# Patient Record
Sex: Male | Born: 1950 | Race: Black or African American | Hispanic: No | Marital: Married | Smoking: Current every day smoker
Health system: Southern US, Community
[De-identification: ages and names within clinical notes are randomized; demographics above are authoritative.]

## PROBLEM LIST (undated history)

## (undated) ENCOUNTER — Emergency Department (HOSPITAL_BASED_OUTPATIENT_CLINIC_OR_DEPARTMENT_OTHER): Payer: Medicare HMO

## (undated) DIAGNOSIS — N189 Chronic kidney disease, unspecified: Secondary | ICD-10-CM

## (undated) DIAGNOSIS — I10 Essential (primary) hypertension: Secondary | ICD-10-CM

## (undated) DIAGNOSIS — M1990 Unspecified osteoarthritis, unspecified site: Secondary | ICD-10-CM

---

## 2007-05-28 HISTORY — PX: HIP ARTHROPLASTY: SHX981

## 2011-12-04 ENCOUNTER — Other Ambulatory Visit: Payer: Self-pay | Admitting: Neurosurgery

## 2011-12-05 ENCOUNTER — Encounter (HOSPITAL_COMMUNITY): Payer: Self-pay | Admitting: Pharmacy Technician

## 2011-12-12 ENCOUNTER — Encounter (HOSPITAL_COMMUNITY): Payer: Self-pay

## 2011-12-12 ENCOUNTER — Encounter (HOSPITAL_COMMUNITY)
Admission: RE | Admit: 2011-12-12 | Discharge: 2011-12-12 | Disposition: A | Discharge status: Home / Self Care | Payer: Worker's Compensation | Source: Ambulatory Visit | Attending: Neurosurgery | Admitting: Neurosurgery

## 2011-12-12 DIAGNOSIS — E119 Type 2 diabetes mellitus without complications: Secondary | ICD-10-CM | POA: Insufficient documentation

## 2011-12-12 DIAGNOSIS — Z01811 Encounter for preprocedural respiratory examination: Secondary | ICD-10-CM | POA: Insufficient documentation

## 2011-12-12 DIAGNOSIS — Z01810 Encounter for preprocedural cardiovascular examination: Secondary | ICD-10-CM | POA: Insufficient documentation

## 2011-12-12 DIAGNOSIS — Z01812 Encounter for preprocedural laboratory examination: Secondary | ICD-10-CM | POA: Insufficient documentation

## 2011-12-12 DIAGNOSIS — Z01818 Encounter for other preprocedural examination: Secondary | ICD-10-CM | POA: Insufficient documentation

## 2011-12-12 DIAGNOSIS — Z538 Procedure and treatment not carried out for other reasons: Secondary | ICD-10-CM | POA: Insufficient documentation

## 2011-12-12 DIAGNOSIS — I10 Essential (primary) hypertension: Secondary | ICD-10-CM | POA: Insufficient documentation

## 2011-12-12 HISTORY — DX: Chronic kidney disease, unspecified: N18.9

## 2011-12-12 HISTORY — DX: Essential (primary) hypertension: I10

## 2011-12-12 HISTORY — DX: Unspecified osteoarthritis, unspecified site: M19.90

## 2011-12-12 LAB — BASIC METABOLIC PANEL
CO2: 26 mEq/L (ref 19–32)
Calcium: 10 mg/dL (ref 8.4–10.5)
Creatinine, Ser: 1.08 mg/dL (ref 0.50–1.35)
Glucose, Bld: 84 mg/dL (ref 70–99)

## 2011-12-12 LAB — CBC
HCT: 41 % (ref 39.0–52.0)
MCH: 32.6 pg (ref 26.0–34.0)
MCV: 94.9 fL (ref 78.0–100.0)
Platelets: 209 10*3/uL (ref 150–400)
RDW: 13.9 % (ref 11.5–15.5)

## 2011-12-12 NOTE — Pre-Procedure Instructions (Signed)
18 Samuel Acosta  12/12/2011   Your procedure is scheduled on:  Wednesday, July 24th.  Report to [facility] at 6:30AM.  Call this number if you have problems the morning of surgery: [phone]   Remember:   Do not eat food or anything to drink:After Midnight.      Take these medicines the morning of surgery with A SIP OF WATER: Metoprolol (Toprolol XL).   Stop taking any Aspirin, NSAIDS (Mobic, Ibuprofen, Advil, Naproxen) And any herbal medicaltions (Fish Oil)  Do not wear jewelry, make-up or nail polish.  Do not wear lotions, powders, or perfumes. You may wear deodorant.  Do not shave 48 hours prior to surgery. Men may shave face and neck.  Do not bring valuables to the hospital.  Contacts, dentures or bridgework may not be worn into surgery.  Leave suitcase in the car. After surgery it may be brought to your room.  For patients admitted to the hospital, checkout time is 11:00 AM the day of discharge.   Patients discharged the day of surgery will not be allowed to drive home.  Name and phone number of your driver: NA  Special Instructions: CHG Shower Use Special Wash: 1/2 bottle night before surgery and 1/2 bottle morning of surgery.   Please read over the following fact sheets that you were given: Pain Booklet, Coughing and Deep Breathing and Surgical Site Infection Prevention

## 2011-12-12 NOTE — Progress Notes (Signed)
I requested office notes and EKG and any cardiac studies from pts APCP - Dr Lyn Hollingshead in [place], [place]

## 2011-12-13 NOTE — Progress Notes (Signed)
RECEIVED OV AND EKG DATED 07/23/2011.Marland Kitchen PLACED IN CHART .Marland KitchenMarland Kitchen ALSO RECEIVED ECHO 2005.Marland Kitchen PLACED IN CHART.

## 2011-12-18 NOTE — Progress Notes (Signed)
Called patient and notified him of arrival time of 06:30 on 8/7. Patient states that he still has instruction sheet. Requested that he follow all other instructions on sheet. Patient told to stop medications listed on sheet to stop on 7/31

## 2011-12-31 ENCOUNTER — Encounter (HOSPITAL_COMMUNITY): Payer: Self-pay

## 2011-12-31 MED ORDER — DEXAMETHASONE SODIUM PHOSPHATE 10 MG/ML IJ SOLN
10.0000 mg | INTRAMUSCULAR | Status: AC
  Administered 2012-01-01: 10 mg via INTRAVENOUS
  Filled 2011-12-31: qty 1

## 2011-12-31 MED ORDER — CEFAZOLIN SODIUM-DEXTROSE 2-3 GM-% IV SOLR
2.0000 g | INTRAVENOUS | Status: AC
  Administered 2012-01-01: 2 g via INTRAVENOUS
  Filled 2011-12-31: qty 50

## 2011-12-31 NOTE — Progress Notes (Signed)
Contacted Dr. Lonie Peak office, left voicemail for [place] requesting consent for surgery date 01/01/12

## 2012-01-01 ENCOUNTER — Ambulatory Visit (HOSPITAL_COMMUNITY): Payer: Worker's Compensation | Admitting: Anesthesiology

## 2012-01-01 ENCOUNTER — Encounter (HOSPITAL_COMMUNITY): Payer: Self-pay | Admitting: Anesthesiology

## 2012-01-01 ENCOUNTER — Inpatient Hospital Stay (HOSPITAL_COMMUNITY)
Admission: RE | Admit: 2012-01-01 | Discharge: 2012-01-02 | DRG: 473 | Disposition: A | Discharge status: Home / Self Care | Payer: Worker's Compensation | Source: Ambulatory Visit | Attending: Neurosurgery | Admitting: Neurosurgery

## 2012-01-01 ENCOUNTER — Ambulatory Visit (HOSPITAL_COMMUNITY): Payer: Worker's Compensation

## 2012-01-01 ENCOUNTER — Encounter (HOSPITAL_COMMUNITY)
Admission: RE | Disposition: A | Discharge status: Home / Self Care | Payer: Self-pay | Source: Ambulatory Visit | Attending: Neurosurgery

## 2012-01-01 DIAGNOSIS — I129 Hypertensive chronic kidney disease with stage 1 through stage 4 chronic kidney disease, or unspecified chronic kidney disease: Secondary | ICD-10-CM | POA: Diagnosis present

## 2012-01-01 DIAGNOSIS — M129 Arthropathy, unspecified: Secondary | ICD-10-CM | POA: Diagnosis present

## 2012-01-01 DIAGNOSIS — Z87442 Personal history of urinary calculi: Secondary | ICD-10-CM

## 2012-01-01 DIAGNOSIS — F17200 Nicotine dependence, unspecified, uncomplicated: Secondary | ICD-10-CM | POA: Diagnosis present

## 2012-01-01 DIAGNOSIS — E119 Type 2 diabetes mellitus without complications: Secondary | ICD-10-CM | POA: Diagnosis present

## 2012-01-01 DIAGNOSIS — M5000 Cervical disc disorder with myelopathy, unspecified cervical region: Principal | ICD-10-CM | POA: Diagnosis present

## 2012-01-01 DIAGNOSIS — N189 Chronic kidney disease, unspecified: Secondary | ICD-10-CM | POA: Diagnosis present

## 2012-01-01 HISTORY — PX: ANTERIOR CERVICAL DECOMP/DISCECTOMY FUSION: SHX1161

## 2012-01-01 LAB — BASIC METABOLIC PANEL
BUN: 14 mg/dL (ref 6–23)
Calcium: 9.8 mg/dL (ref 8.4–10.5)
GFR calc Af Amer: 78 mL/min — ABNORMAL LOW (ref >90)
GFR calc non Af Amer: 67 mL/min — ABNORMAL LOW (ref >90)
Potassium: 4.1 mEq/L (ref 3.5–5.1)
Sodium: 139 mEq/L (ref 135–145)

## 2012-01-01 LAB — CBC
HCT: 41.9 % (ref 39.0–52.0)
MCH: 32.1 pg (ref 26.0–34.0)
MCHC: 33.7 g/dL (ref 30.0–36.0)
RDW: 14.4 % (ref 11.5–15.5)

## 2012-01-01 LAB — GLUCOSE, CAPILLARY

## 2012-01-01 SURGERY — ANTERIOR CERVICAL DECOMPRESSION/DISCECTOMY FUSION 3 LEVELS
Anesthesia: General | Site: Neck | Wound class: Clean

## 2012-01-01 MED ORDER — THROMBIN 20000 UNITS EX KIT
PACK | CUTANEOUS | Status: DC | PRN
  Administered 2012-01-01: 09:00:00 via TOPICAL

## 2012-01-01 MED ORDER — MIDAZOLAM HCL 5 MG/5ML IJ SOLN
INTRAMUSCULAR | Status: DC | PRN
  Administered 2012-01-01: 1 mg via INTRAVENOUS

## 2012-01-01 MED ORDER — HYDROMORPHONE HCL PF 1 MG/ML IJ SOLN: 0.5000 mg | INTRAMUSCULAR | Status: DC | PRN

## 2012-01-01 MED ORDER — ASPIRIN EC 81 MG PO TBEC
81.0000 mg | DELAYED_RELEASE_TABLET | Freq: Every day | ORAL | Status: DC
  Administered 2012-01-01: 81 mg via ORAL
  Filled 2012-01-01 (×2): qty 1

## 2012-01-01 MED ORDER — PROMETHAZINE HCL 25 MG/ML IJ SOLN: 6.2500 mg | INTRAMUSCULAR | Status: DC | PRN

## 2012-01-01 MED ORDER — ONDANSETRON HCL 4 MG/2ML IJ SOLN: 4.0000 mg | INTRAMUSCULAR | Status: DC | PRN

## 2012-01-01 MED ORDER — ONDANSETRON HCL 4 MG/2ML IJ SOLN
INTRAMUSCULAR | Status: DC | PRN
  Administered 2012-01-01: 4 mg via INTRAVENOUS

## 2012-01-01 MED ORDER — PHENOL 1.4 % MT LIQD
1.0000 | OROMUCOSAL | Status: DC | PRN
  Administered 2012-01-01: 1 via OROMUCOSAL
  Filled 2012-01-01: qty 177

## 2012-01-01 MED ORDER — NEOSTIGMINE METHYLSULFATE 1 MG/ML IJ SOLN
INTRAMUSCULAR | Status: DC | PRN
  Administered 2012-01-01: 3 mg via INTRAVENOUS

## 2012-01-01 MED ORDER — ACETAMINOPHEN 325 MG PO TABS: 650.0000 mg | ORAL_TABLET | ORAL | Status: DC | PRN

## 2012-01-01 MED ORDER — ACETAMINOPHEN 650 MG RE SUPP: 650.0000 mg | RECTAL | Status: DC | PRN

## 2012-01-01 MED ORDER — METFORMIN HCL 850 MG PO TABS
850.0000 mg | ORAL_TABLET | Freq: Two times a day (BID) | ORAL | Status: DC
  Administered 2012-01-01 – 2012-01-02 (×2): 850 mg via ORAL
  Filled 2012-01-01 (×4): qty 1

## 2012-01-01 MED ORDER — CEFAZOLIN SODIUM 1-5 GM-% IV SOLN
1.0000 g | Freq: Three times a day (TID) | INTRAVENOUS | Status: AC
  Administered 2012-01-01 – 2012-01-02 (×2): 1 g via INTRAVENOUS
  Filled 2012-01-01 (×2): qty 50

## 2012-01-01 MED ORDER — THROMBIN 5000 UNITS EX SOLR
CUTANEOUS | Status: DC | PRN
  Administered 2012-01-01: 11:00:00 via TOPICAL

## 2012-01-01 MED ORDER — FENTANYL CITRATE 0.05 MG/ML IJ SOLN
INTRAMUSCULAR | Status: DC | PRN
  Administered 2012-01-01: 100 ug via INTRAVENOUS
  Administered 2012-01-01 (×4): 50 ug via INTRAVENOUS

## 2012-01-01 MED ORDER — GLYCOPYRROLATE 0.2 MG/ML IJ SOLN
INTRAMUSCULAR | Status: DC | PRN
  Administered 2012-01-01: 0.6 mg via INTRAVENOUS

## 2012-01-01 MED ORDER — PHENOL 1.4 % MT LIQD: 1.0000 | OROMUCOSAL | Status: DC | PRN

## 2012-01-01 MED ORDER — CYCLOBENZAPRINE HCL 10 MG PO TABS
10.0000 mg | ORAL_TABLET | Freq: Three times a day (TID) | ORAL | Status: DC | PRN
  Administered 2012-01-01: 10 mg via ORAL
  Filled 2012-01-01: qty 1

## 2012-01-01 MED ORDER — BACITRACIN 50000 UNITS IM SOLR
INTRAMUSCULAR | Status: AC
  Filled 2012-01-01: qty 1

## 2012-01-01 MED ORDER — SODIUM CHLORIDE 0.9 % IJ SOLN
3.0000 mL | Freq: Two times a day (BID) | INTRAMUSCULAR | Status: DC
  Administered 2012-01-01 (×2): 3 mL via INTRAVENOUS

## 2012-01-01 MED ORDER — MEPERIDINE HCL 25 MG/ML IJ SOLN: 6.2500 mg | INTRAMUSCULAR | Status: DC | PRN

## 2012-01-01 MED ORDER — SODIUM CHLORIDE 0.9 % IV SOLN
INTRAVENOUS | Status: AC
  Filled 2012-01-01: qty 500

## 2012-01-01 MED ORDER — SODIUM CHLORIDE 0.9 % IV SOLN: 250.0000 mL | INTRAVENOUS | Status: DC

## 2012-01-01 MED ORDER — ROCURONIUM BROMIDE 100 MG/10ML IV SOLN
INTRAVENOUS | Status: DC | PRN
  Administered 2012-01-01: 50 mg via INTRAVENOUS
  Administered 2012-01-01 (×2): 10 mg via INTRAVENOUS

## 2012-01-01 MED ORDER — LISINOPRIL 10 MG PO TABS
10.0000 mg | ORAL_TABLET | Freq: Every day | ORAL | Status: DC
  Administered 2012-01-01: 10 mg via ORAL
  Filled 2012-01-01 (×2): qty 1

## 2012-01-01 MED ORDER — LACTATED RINGERS IV SOLN
INTRAVENOUS | Status: DC | PRN
  Administered 2012-01-01 (×2): via INTRAVENOUS

## 2012-01-01 MED ORDER — ALUM & MAG HYDROXIDE-SIMETH 200-200-20 MG/5ML PO SUSP: 30.0000 mL | Freq: Four times a day (QID) | ORAL | Status: DC | PRN

## 2012-01-01 MED ORDER — HYDROMORPHONE HCL PF 1 MG/ML IJ SOLN
0.2500 mg | INTRAMUSCULAR | Status: DC | PRN
  Administered 2012-01-01 (×2): 0.5 mg via INTRAVENOUS

## 2012-01-01 MED ORDER — OXYCODONE-ACETAMINOPHEN 5-325 MG PO TABS
1.0000 | ORAL_TABLET | ORAL | Status: DC | PRN
  Administered 2012-01-01 – 2012-01-02 (×4): 2 via ORAL
  Filled 2012-01-01 (×4): qty 2

## 2012-01-01 MED ORDER — METOPROLOL SUCCINATE ER 100 MG PO TB24
100.0000 mg | ORAL_TABLET | Freq: Every day | ORAL | Status: DC
  Filled 2012-01-01: qty 1

## 2012-01-01 MED ORDER — PROPOFOL 10 MG/ML IV EMUL
INTRAVENOUS | Status: DC | PRN
  Administered 2012-01-01: 150 mg via INTRAVENOUS

## 2012-01-01 MED ORDER — PIOGLITAZONE HCL-METFORMIN HCL 15-850 MG PO TABS: 1.0000 | ORAL_TABLET | Freq: Two times a day (BID) | ORAL | Status: DC

## 2012-01-01 MED ORDER — LINAGLIPTIN 5 MG PO TABS
5.0000 mg | ORAL_TABLET | Freq: Every day | ORAL | Status: DC
  Administered 2012-01-01: 5 mg via ORAL
  Filled 2012-01-01 (×2): qty 1

## 2012-01-01 MED ORDER — PIOGLITAZONE HCL 15 MG PO TABS
15.0000 mg | ORAL_TABLET | Freq: Two times a day (BID) | ORAL | Status: DC
  Administered 2012-01-01 – 2012-01-02 (×2): 15 mg via ORAL
  Filled 2012-01-01 (×4): qty 1

## 2012-01-01 MED ORDER — LISINOPRIL-HYDROCHLOROTHIAZIDE 10-12.5 MG PO TABS: 1.0000 | ORAL_TABLET | Freq: Every day | ORAL | Status: DC

## 2012-01-01 MED ORDER — LIDOCAINE HCL (CARDIAC) 20 MG/ML IV SOLN
INTRAVENOUS | Status: DC | PRN
  Administered 2012-01-01: 50 mg via INTRAVENOUS

## 2012-01-01 MED ORDER — 0.9 % SODIUM CHLORIDE (POUR BTL) OPTIME
TOPICAL | Status: DC | PRN
  Administered 2012-01-01: 1000 mL

## 2012-01-01 MED ORDER — SODIUM CHLORIDE 0.9 % IR SOLN
Status: DC | PRN
  Administered 2012-01-01: 09:00:00

## 2012-01-01 MED ORDER — MENTHOL 3 MG MT LOZG: 1.0000 | LOZENGE | OROMUCOSAL | Status: DC | PRN

## 2012-01-01 MED ORDER — HYDROCHLOROTHIAZIDE 12.5 MG PO CAPS
12.5000 mg | ORAL_CAPSULE | Freq: Every day | ORAL | Status: DC
  Administered 2012-01-01: 12.5 mg via ORAL
  Filled 2012-01-01 (×2): qty 1

## 2012-01-01 MED ORDER — HYDROMORPHONE HCL PF 1 MG/ML IJ SOLN
INTRAMUSCULAR | Status: AC
  Filled 2012-01-01: qty 1

## 2012-01-01 SURGICAL SUPPLY — 60 items
BAG DECANTER FOR FLEXI CONT (MISCELLANEOUS) ×2 IMPLANT
BENZOIN TINCTURE PRP APPL 2/3 (GAUZE/BANDAGES/DRESSINGS) ×2 IMPLANT
BRUSH SCRUB EZ PLAIN DRY (MISCELLANEOUS) ×2 IMPLANT
BUR MATCHSTICK NEURO 3.0 LAGG (BURR) ×4 IMPLANT
CANISTER SUCTION 2500CC (MISCELLANEOUS) ×2 IMPLANT
CLOTH BEACON ORANGE TIMEOUT ST (SAFETY) ×2 IMPLANT
CONT SPEC 4OZ CLIKSEAL STRL BL (MISCELLANEOUS) ×2 IMPLANT
DECANTER SPIKE VIAL GLASS SM (MISCELLANEOUS) ×2 IMPLANT
DERMABOND ADVANCED (GAUZE/BANDAGES/DRESSINGS) ×1
DERMABOND ADVANCED .7 DNX12 (GAUZE/BANDAGES/DRESSINGS) ×1 IMPLANT
DRAIN SNY WOU 7FLT (WOUND CARE) ×2 IMPLANT
DRAPE C-ARM 42X72 X-RAY (DRAPES) ×4 IMPLANT
DRAPE LAPAROTOMY 100X72 PEDS (DRAPES) ×2 IMPLANT
DRAPE MICROSCOPE ZEISS OPMI (DRAPES) ×2 IMPLANT
DRAPE POUCH INSTRU U-SHP 10X18 (DRAPES) ×2 IMPLANT
DRILL BIT (BIT) ×2 IMPLANT
DRSG OPSITE 4X5.5 SM (GAUZE/BANDAGES/DRESSINGS) ×2 IMPLANT
ELECT COATED BLADE 2.86 ST (ELECTRODE) ×2 IMPLANT
ELECT REM PT RETURN 9FT ADLT (ELECTROSURGICAL) ×2
ELECTRODE REM PT RTRN 9FT ADLT (ELECTROSURGICAL) ×1 IMPLANT
EVACUATOR SILICONE 100CC (DRAIN) ×2 IMPLANT
GAUZE SPONGE 4X4 16PLY XRAY LF (GAUZE/BANDAGES/DRESSINGS) ×2 IMPLANT
GLOVE BIO SURGEON STRL SZ8 (GLOVE) ×2 IMPLANT
GLOVE BIOGEL PI IND STRL 8 (GLOVE) ×2 IMPLANT
GLOVE BIOGEL PI INDICATOR 8 (GLOVE) ×2
GLOVE ECLIPSE 7.5 STRL STRAW (GLOVE) ×6 IMPLANT
GLOVE EXAM NITRILE LRG STRL (GLOVE) IMPLANT
GLOVE EXAM NITRILE MD LF STRL (GLOVE) ×4 IMPLANT
GLOVE EXAM NITRILE XL STR (GLOVE) IMPLANT
GLOVE EXAM NITRILE XS STR PU (GLOVE) IMPLANT
GLOVE INDICATOR 8.5 STRL (GLOVE) ×2 IMPLANT
GOWN BRE IMP SLV AUR LG STRL (GOWN DISPOSABLE) ×2 IMPLANT
GOWN BRE IMP SLV AUR XL STRL (GOWN DISPOSABLE) ×4 IMPLANT
GOWN STRL REIN 2XL LVL4 (GOWN DISPOSABLE) ×2 IMPLANT
HEAD HALTER (SOFTGOODS) ×2 IMPLANT
HEMOSTAT POWDER KIT SURGIFOAM (HEMOSTASIS) IMPLANT
KIT BASIN OR (CUSTOM PROCEDURE TRAY) ×2 IMPLANT
KIT ROOM TURNOVER OR (KITS) ×2 IMPLANT
NEEDLE SPNL 20GX3.5 QUINCKE YW (NEEDLE) ×2 IMPLANT
NS IRRIG 1000ML POUR BTL (IV SOLUTION) ×2 IMPLANT
PACK LAMINECTOMY NEURO (CUSTOM PROCEDURE TRAY) ×2 IMPLANT
PLATE 3 57.5XLCK NS SPNE CVD (Plate) ×1 IMPLANT
PLATE 3 ATLANTIS TRANS (Plate) ×1 IMPLANT
PUTTY BONE DBX 2.5 MIS (Bone Implant) ×2 IMPLANT
RUBBERBAND STERILE (MISCELLANEOUS) ×4 IMPLANT
SCREW ST FIX 4 ATL 3120213 (Screw) ×16 IMPLANT
SPACER COLONIAL LGE 8MM 7DEG (Spacer) ×6 IMPLANT
SPONGE GAUZE 4X4 12PLY (GAUZE/BANDAGES/DRESSINGS) ×2 IMPLANT
SPONGE INTESTINAL PEANUT (DISPOSABLE) ×2 IMPLANT
SPONGE SURGIFOAM ABS GEL 100 (HEMOSTASIS) ×2 IMPLANT
STRIP CLOSURE SKIN 1/2X4 (GAUZE/BANDAGES/DRESSINGS) ×2 IMPLANT
SUT VIC AB 3-0 SH 8-18 (SUTURE) ×2 IMPLANT
SUT VICRYL 4-0 PS2 18IN ABS (SUTURE) ×2 IMPLANT
SYR 20ML ECCENTRIC (SYRINGE) ×2 IMPLANT
TAPE CLOTH 4X10 WHT NS (GAUZE/BANDAGES/DRESSINGS) IMPLANT
TOWEL OR 17X24 6PK STRL BLUE (TOWEL DISPOSABLE) ×2 IMPLANT
TOWEL OR 17X26 10 PK STRL BLUE (TOWEL DISPOSABLE) ×2 IMPLANT
TRAP SPECIMEN MUCOUS 40CC (MISCELLANEOUS) ×2 IMPLANT
TRAY FOLEY CATH 14FRSI W/METER (CATHETERS) ×2 IMPLANT
WATER STERILE IRR 1000ML POUR (IV SOLUTION) ×2 IMPLANT

## 2012-01-01 NOTE — Progress Notes (Signed)
Orthopedic Tech Progress Note Patient Details:  Samuel Acosta Oct 25, 1950 [id number] Soft collar fitted by [employer] vendor, went to room and patient had it on. Patient ID: [name], male   DOB: 09/15/1950, 61 y.o.   MRN: [id number]   Jennye Moccasin 01/01/2012, 8:01 PM

## 2012-01-01 NOTE — Anesthesia Postprocedure Evaluation (Signed)
  Anesthesia Post-op Note  Patient: Samuel Acosta  Procedure(s) Performed: Procedure(s) (LRB): ANTERIOR CERVICAL DECOMPRESSION/DISCECTOMY FUSION 3 LEVELS (N/A)  Patient Location: PACU  Anesthesia Type: General  Level of Consciousness: awake  Airway and Oxygen Therapy: Patient Spontanous Breathing  Post-op Pain: mild  Post-op Assessment: Post-op Vital signs reviewed  Post-op Vital Signs: stable  Complications: No apparent anesthesia complications

## 2012-01-01 NOTE — Plan of Care (Signed)
Problem: Consults Goal: Diagnosis - Spinal Surgery Outcome: Completed/Met Date Met:  01/01/12 Cervical Spine Fusion

## 2012-01-01 NOTE — H&P (Signed)
Samuel Acosta is an 61 y.o. male.   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  Past Medical History  Diagnosis Date  . Hypertension   . Diabetes mellitus   . Chronic kidney disease     Kidney Stone  . Arthritis     Past Surgical History  Procedure Date  . Hip arthroplasty 2009    Right    History reviewed. No pertinent family history. Social History:  reports that he has been smoking.  He does not have any smokeless tobacco history on file. He reports that he drinks about 2.4 ounces of alcohol per week. He reports that he does not use illicit drugs.  Allergies:  Allergies  Allergen Reactions  . Shrimp (Shellfish Allergy) Other (See Comments)    Irritates stomach  . Other Nausea And Vomiting    Medications Prior to Admission  Medication Sig Dispense Refill  . aspirin EC 81 MG tablet Take 81 mg by mouth daily.      Marland Kitchen lisinopril-hydrochlorothiazide (PRINZIDE,ZESTORETIC) 10-12.5 MG per tablet Take 1 tablet by mouth daily.      . meloxicam (MOBIC) 15 MG tablet Take 15 mg by mouth daily as needed. For pain      . metoprolol succinate (TOPROL-XL) 100 MG 24 hr tablet Take 100 mg by mouth daily. Take with or immediately following a meal.      . Omega-3 Fatty Acids (FISH OIL PO) Take 1  capsule by mouth daily.      . pioglitazone-metformin (ACTOPLUS MET) 15-850 MG per tablet Take 1 tablet by mouth 2 (two) times daily with a meal.      . sitaGLIPtin (JANUVIA) 100 MG tablet Take 100 mg by mouth daily.        Results for orders placed during the hospital encounter of 01/01/12 (from the past 48 hour(s))  GLUCOSE, CAPILLARY     Status: Normal   Collection Time   01/01/12  6:36 AM      Component Value Range Comment   Glucose-Capillary 94  70 - 99 mg/dL    Comment 1 Notify RN     CBC     Status: Normal   Collection Time   01/01/12  6:40 AM      Component Value Range Comment   WBC 7.3  4.0 - 10.5 K/uL    RBC 4.39  4.22 - 5.81 MIL/uL    Hemoglobin 14.1  13.0 - 17.0 g/dL    HCT 96.0  45.4 - 09.8 %    MCV 95.4  78.0 - 100.0 fL    MCH 32.1  26.0 - 34.0 pg    MCHC 33.7  30.0 - 36.0 g/dL    RDW 11.9  14.7 - 82.9 %    Platelets 221  150 - 400 K/uL   BASIC METABOLIC PANEL  Status: Abnormal   Collection Time   01/01/12  6:40 AM      Component Value Range Comment   Sodium 139  135 - 145 mEq/L    Potassium 4.1  3.5 - 5.1 mEq/L    Chloride 103  96 - 112 mEq/L    CO2 26  19 - 32 mEq/L    Glucose, Bld 103 (*) 70 - 99 mg/dL    BUN 14  6 - 23 mg/dL    Creatinine, Ser 1.91  0.50 - 1.35 mg/dL    Calcium 9.8  8.4 - 47.8 mg/dL    GFR calc non Af Amer 67 (*) >90 mL/min    GFR calc Af Amer 78 (*) >90 mL/min    No results found.  Review of Systems  Constitutional: Negative.   HENT: Positive for neck pain.   Eyes: Negative.   Respiratory: Negative.   Cardiovascular: Negative.   Gastrointestinal: Negative.   Genitourinary: Negative.   Skin: Negative.   Neurological: Positive for tingling.  Psychiatric/Behavioral: Negative.     Blood pressure 151/91, pulse 80, temperature 97.9 F (36.6 C), temperature source Oral, resp. rate 18, height 5\' 8"  (1.727 m), weight 77.111 kg (170 lb), SpO2 100.00%. Physical Exam  Constitutional: He is oriented to person, place, and time. He appears  well-developed and well-nourished.  HENT:  Head: Normocephalic.  Eyes: Pupils are equal, round, and reactive to light.  Neck: Normal range of motion.  Cardiovascular: Normal rate.   Respiratory: Effort normal.  GI: Soft.  Neurological: He is alert and oriented to person, place, and time. GCS eye subscore is 4. GCS verbal subscore is 5. GCS motor subscore is 6.  Reflex Scores:      Tricep reflexes are 2+ on the right side and 2+ on the left side.      Bicep reflexes are 2+ on the right side and 2+ on the left side.      Brachioradialis reflexes are 3+ on the right side and 3+ on the left side.      Patellar reflexes are 3+ on the right side and 3+ on the left side.      Achilles reflexes are 3+ on the right side and 3+ on the left side.    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 I recommended an anterior cervical discectomy and fusion at C4-5 C3-4 and C5-6 risks and benefits of the operation were cemented patient as well as perioperative course and expectations of outcome alternatives of surgery. He understands all these and agrees to proceed forward.  Jahan Friedlander P 01/01/2012, 8:32 AM

## 2012-01-01 NOTE — Progress Notes (Signed)
UR COMPLETED  

## 2012-01-01 NOTE — Anesthesia Procedure Notes (Signed)
Date/Time: 01/01/2012 8:57 AM Performed by: Gwenyth Allegra Pre-anesthesia Checklist: Emergency Drugs available, Patient identified, Timeout performed, Suction available and Patient being monitored Patient Re-evaluated:Patient Re-evaluated prior to inductionOxygen Delivery Method: Circle system utilized Preoxygenation: Pre-oxygenation with 100% oxygen Intubation Type: IV induction Ventilation: Oral airway inserted - appropriate to patient size and Mask ventilation without difficulty Laryngoscope Size: Mac and 4 Grade View: Grade II Tube type: Oral Tube size: 7.5 mm Number of attempts: 1 Airway Equipment and Method: Stylet Placement Confirmation: ETT inserted through vocal cords under direct vision,  breath sounds checked- equal and bilateral and positive ETCO2 Secured at: 21 cm Tube secured with: Tape Dental Injury: Teeth and Oropharynx as per pre-operative assessment

## 2012-01-01 NOTE — Anesthesia Preprocedure Evaluation (Signed)
Anesthesia Evaluation  Patient identified by MRN, date of birth, ID band Patient awake    History of Anesthesia Complications Negative for: history of anesthetic complications  Airway Mallampati: II  Neck ROM: Full    Dental  (+) Partial Lower and Poor Dentition   Pulmonary pneumonia -,  breath sounds clear to auscultation        Cardiovascular hypertension, Rhythm:Regular Rate:Normal     Neuro/Psych    GI/Hepatic   Endo/Other    Renal/GU      Musculoskeletal   Abdominal (+) + obese,   Peds  Hematology   Anesthesia Other Findings   Reproductive/Obstetrics                           Anesthesia Physical Anesthesia Plan  ASA: II  Anesthesia Plan: General   Post-op Pain Management:    Induction: Intravenous  Airway Management Planned: Oral ETT  Additional Equipment:   Intra-op Plan:   Post-operative Plan: Extubation in OR  Informed Consent: I have reviewed the patients History and Physical, chart, labs and discussed the procedure including the risks, benefits and alternatives for the proposed anesthesia with the patient or authorized representative who has indicated his/her understanding and acceptance.   Dental advisory given  Plan Discussed with: CRNA and Surgeon  Anesthesia Plan Comments:         Anesthesia Quick Evaluation

## 2012-01-01 NOTE — Transfer of Care (Signed)
Immediate Anesthesia Transfer of Care Note  Patient: Samuel Acosta  Procedure(s) Performed: Procedure(s) (LRB): ANTERIOR CERVICAL DECOMPRESSION/DISCECTOMY FUSION 3 LEVELS (N/A)  Patient Location: PACU  Anesthesia Type: General  Level of Consciousness: awake and alert   Airway & Oxygen Therapy: Patient Spontanous Breathing and Patient connected to nasal cannula oxygen  Post-op Assessment: Report given to PACU RN and Post -op Vital signs reviewed and stable  Post vital signs: Reviewed and stable  Complications: No apparent anesthesia complications

## 2012-01-01 NOTE — Preoperative (Signed)
Beta Blockers   Reason not to administer Beta Blockers:Not Applicable 

## 2012-01-01 NOTE — Op Note (Signed)
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  EBL: Minimal  History of present illness: Patient is a 60 years and presents with progressive worsening neck him a) left arm pain with weakness in the shoulder weakness and numbness tingling in his hands workup revealed severe spinal cord compression with signal changes cord behind C4-5 with severe cervical stenosis at C3-4 C4-5 C5-6 due to patient's progression of clinical syndrome failed conservative treatment MRI and imaging findings patient recommended anterior cervical discectomies and fusion at C3-4 C4-5 C5-6 with a wrist nevus of the operation with him he understands and agrees to proceed forward as well as perioperative course and expectations of outcome alternatives to surgery.  Operative procedure: Patient was brought into the or was induced under general anesthesia positioned supine the neck in slight extension in 5 pounds of halter traction the right-sided was prepped and draped in routine sterile fashion. Preoperative localizing appropriate level so after a curvilinear incision was made just off midline to the root of the sternomastoid and the superficial layer of the platysmas dissected and divided longitudinally the avascular tissue sternomastoid stem as was developed down through the fascia and prevertebral fascia was dissected away with Kitners. Interoperative X. identify the appropriate level so annulotomy was made with the scalpel marked the disc space and self-retaining retractors placed the then both the space were further incised large anterior ossified of did not [place] rongeur  lungs close affected laterally and self-retaining retractors placed. Using a mucous trap potential bone shavings all 3 disc space is drilled down the posterior annulus facet complex first working at C5-6 the operating microscope was draped and brought into the field and mycotic illumination the disc space was further drilled down aggressive and viable template is carried out large posterior spur scar the C5 vertebral body decompress the central canal the March across laterally both proximal C6 nerve roots were identified and both C6 nerve rim were decompressed flush with pedicle. Again the discectomy there is no further stenosis this level to second C4-5 and a similar fashion C4-5 is drilled down the soft disc herniation causing severe spinal cord compression displacement predominately to the right was severe stenosis at right C5 neuroforamen. Still decompress skeletonizing both C5 nerve roots flush with pedicle. At the tip is no further stenosis centrally or foraminally at this level either. In working at C3-4 and a similar fashion C3-4 was drilled down. Large central spur was aggressively and bitten of the C3 vertebral body PLL was removed in piecemeal fashion both C4 pedicles were identified both C4 nerve roots were skeletonized flush with pedicle to at L. discectomies sites were inspected to confirm adequate decompression 38 mm peek cages packed with local are graft mixed DBX and inserted one 2 mm deep to the anterior vertebral body line. Then and Atlantis translational plate was sized all screws are placed all screws excellent purchase locking mechanisms were engaged. The wounds and copious irrigated meticulous hemostasis was maintained a J-P drain was placed and the wounds closed in layers after postoperative fluoroscopy confirmed good position of all plate screws and implants. The skin was closed running 4 septic or Dermabond benzo and Steri-Strips patient recovered in stable condition. At  the end of case on  it counts sponge counts were correct.

## 2012-01-02 LAB — GLUCOSE, CAPILLARY

## 2012-01-02 MED ORDER — CYCLOBENZAPRINE HCL 10 MG PO TABS: 10.0000 mg | ORAL_TABLET | Freq: Three times a day (TID) | ORAL | Status: AC | PRN

## 2012-01-02 MED ORDER — OXYCODONE-ACETAMINOPHEN 5-325 MG PO TABS: 1.0000 | ORAL_TABLET | ORAL | Status: AC | PRN

## 2012-01-02 NOTE — Progress Notes (Signed)
Subjective: Patient reports he feels much better hands are much improved swelling is low but harvested with a soft was what was in his angling and voiding  Objective: Vital signs in last 24 hours: Temp:  [96.8 F (36 C)-98.7 F (37.1 C)] 98.6 F (37 C) (08/08 0343) Pulse Rate:  [71-84] 71  (08/08 0343) Resp:  [14-24] 18  (08/08 0343) BP: (130-154)/(63-91) 154/66 mmHg (08/08 0343) SpO2:  [95 %-100 %] 97 % (08/08 0343)  Intake/Output from previous day: 08/07 0701 - 08/08 0700 In: 2210 [P.O.:960; I.V.:1250] Out: 270 [Urine:150; Drains:70; Blood:50] Intake/Output this shift:    Strength out of 5 wound clean and dry  Lab Results:  [facility] 01/01/12 0640  WBC 7.3  HGB 14.1  HCT 41.9  PLT 221   BMET  Basename 01/01/12 0640  NA 139  K 4.1  CL 103  CO2 26  GLUCOSE 103*  BUN 14  CREATININE 1.14  CALCIUM 9.8    Studies/Results: Dg Cervical Spine 1 View  01/01/2012  *RADIOLOGY REPORT*  Clinical Data: C3-C6 ACDF.  DG CERVICAL SPINE - 1 VIEW  Comparison: None.  Findings: Two spot fluoroscopic images of the cervical spine are submitted from the operating room.  These demonstrate the placement of an anterior plate, screws and intervertebral bone plugs from C3- C6.  Hardware is well positioned.  There is a surgical sponge anteriorly within the operative bed.  The patient is intubated.  IMPRESSION: Intraoperative views during C3-C6 ACDF.  Original Report Authenticated By: Gerrianne Scale, M.D.   Dg C-arm 1-60 Min  01/01/2012  *RADIOLOGY REPORT*  Clinical Data: C3-C6 ACDF.  DG C-ARM 1-60 MIN  Technique: Fluoroscopic guidance was provided in the operating room.  IMPRESSION: Intraoperative fluoroscopic guidance.  Original Report Authenticated By: Gerrianne Scale, M.D.    Assessment/Plan: Discharge home  LOS: 1 day     Desree Leap P 01/02/2012, 7:38 AM

## 2012-01-02 NOTE — Discharge Summary (Signed)
  Physician Discharge Summary  Patient ID: Samuel Acosta MRN: [id number] DOB/AGE: 11/15/1950 61 y.o.  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 *    Discharged Condition: good  Hospital Course: Patient is been hospital underwent an anterior cervical discectomy fusion at C3-4, C4-5, C5-6. Postoperatively patient did very well recovered in the floor on the floor was convalescing well ambulating and voiding spontaneously tolerating a diet  Consults: Significant Diagnostic Studies: Treatments: ACDF C34 C4-5 C5-6 Discharge Exam: Blood pressure 154/66, pulse 71, temperature 98.6 F (37 C), temperature source Oral, resp. rate 18, height 5\' 8"  (1.727 m), weight 77.111 kg (170 lb), SpO2 97.00%. Strength 5 out of 5 wound clean and dry  Disposition: Home   Medication List  As of 01/02/2012  7:40 AM   TAKE these medications         aspirin EC 81 MG tablet   Take 81 mg by mouth daily.      cyclobenzaprine 10 MG tablet   Commonly known as: FLEXERIL   Take 1 tablet (10 mg total) by mouth 3 (three) times daily as needed for muscle spasms.      FISH OIL PO   Take 1 capsule by mouth daily.      lisinopril-hydrochlorothiazide 10-12.5 MG per tablet   Commonly known as: PRINZIDE,ZESTORETIC   Take 1 tablet by mouth daily.      meloxicam 15 MG tablet   Commonly known as: MOBIC   Take 15 mg by mouth daily as needed. For pain      metoprolol succinate 100 MG 24 hr tablet   Commonly known as: TOPROL-XL   Take 100 mg by mouth daily. Take with or immediately following a meal.      oxyCODONE-acetaminophen 5-325 MG per tablet   Commonly known as: PERCOCET/ROXICET   Take 1-2 tablets by mouth every 4 (four) hours as needed.      pioglitazone-metformin 15-850 MG per tablet   Commonly known as: ACTOPLUS MET   Take 1 tablet by mouth  2 (two) times daily with a meal.      sitaGLIPtin 100 MG tablet   Commonly known as: JANUVIA   Take 100 mg by mouth daily.             Signed: Shelanda Duvall P 01/02/2012, 7:40 AM

## 2012-01-02 NOTE — Progress Notes (Signed)
Utilization review completed. Lounette Sloan, RN, BSN. 

## 2012-01-02 NOTE — Progress Notes (Signed)
Pt given D/C instructions with Rx's, Pt verbalized understanding. Pt D/C'd home with wife via wheelchair @ 0940 per MD order. Rema Fendt, RN

## 2012-01-03 ENCOUNTER — Encounter (HOSPITAL_COMMUNITY): Payer: Self-pay | Admitting: Neurosurgery

## 2013-10-17 IMAGING — CR DG CHEST 2V
2 series · 2 of 2 positions shown · non-contrast
Comparison: None.

CLINICAL DATA: Preoperative respiratory films.

CHEST - 2 VIEW

[view not recorded (1 of 2)]
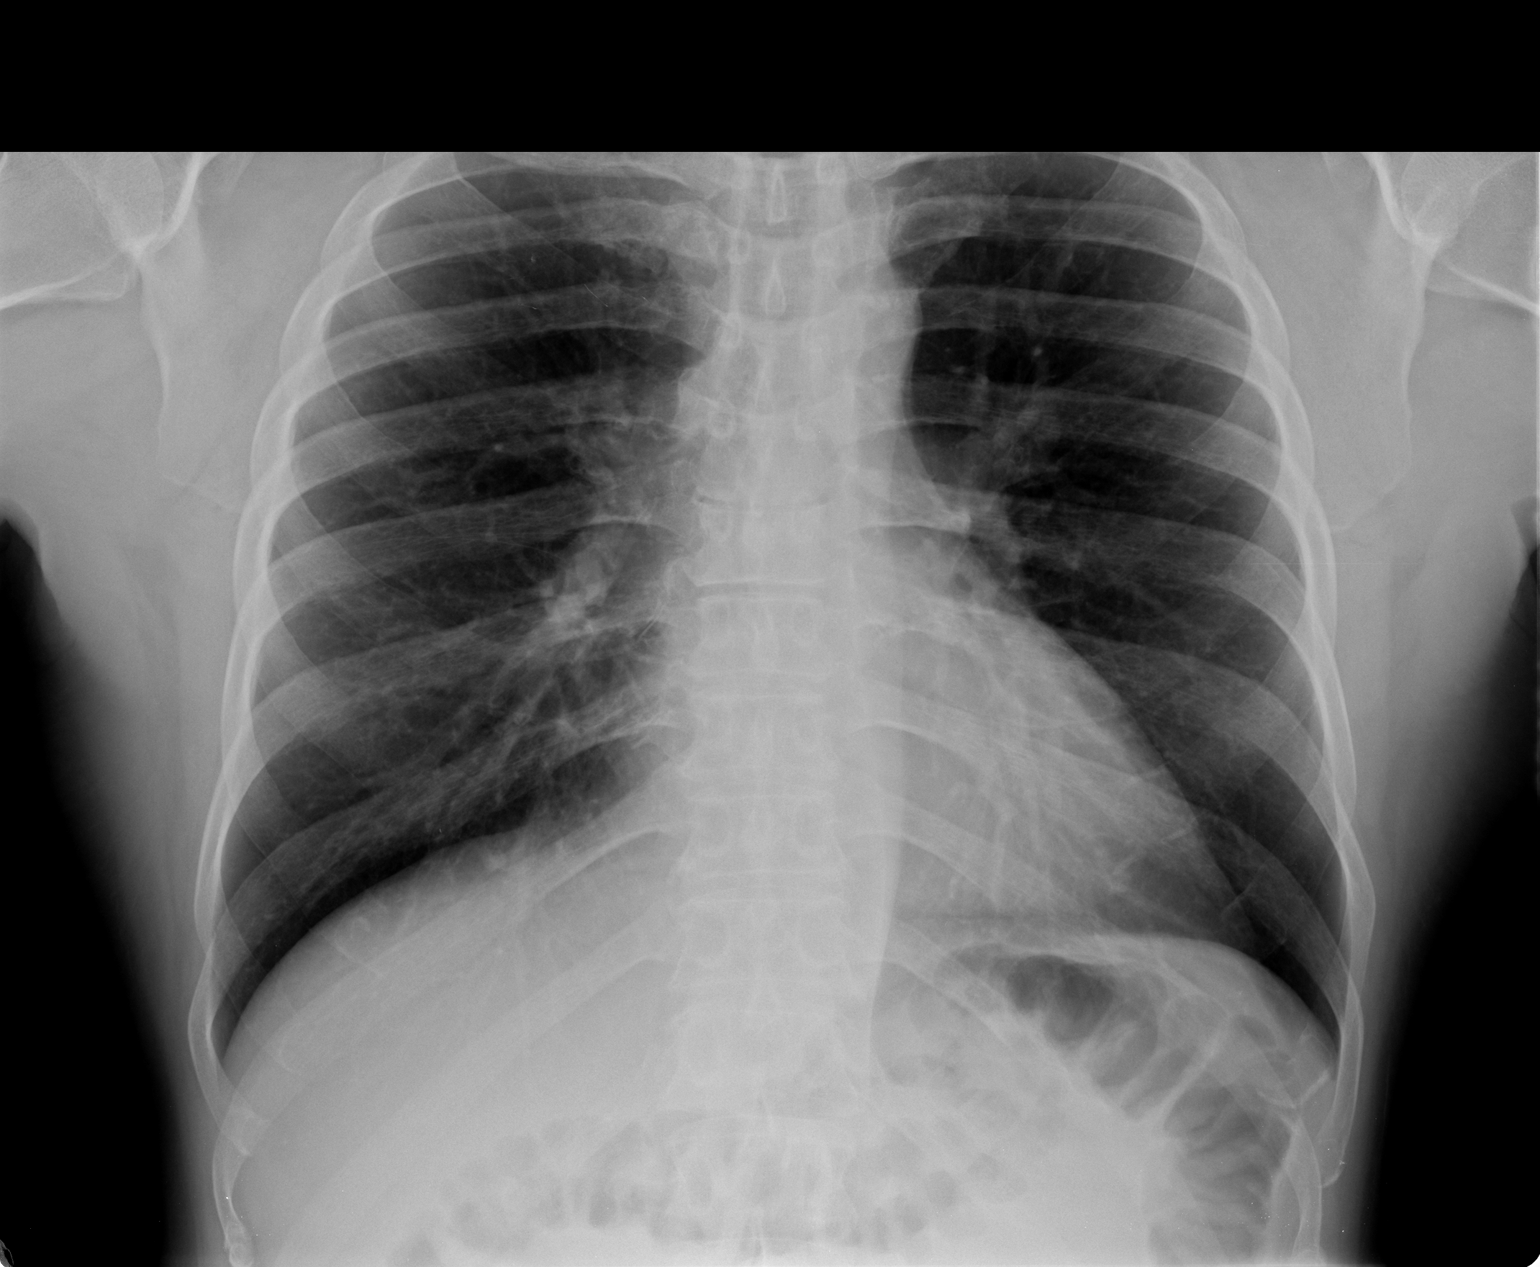

[view not recorded (2 of 2)]
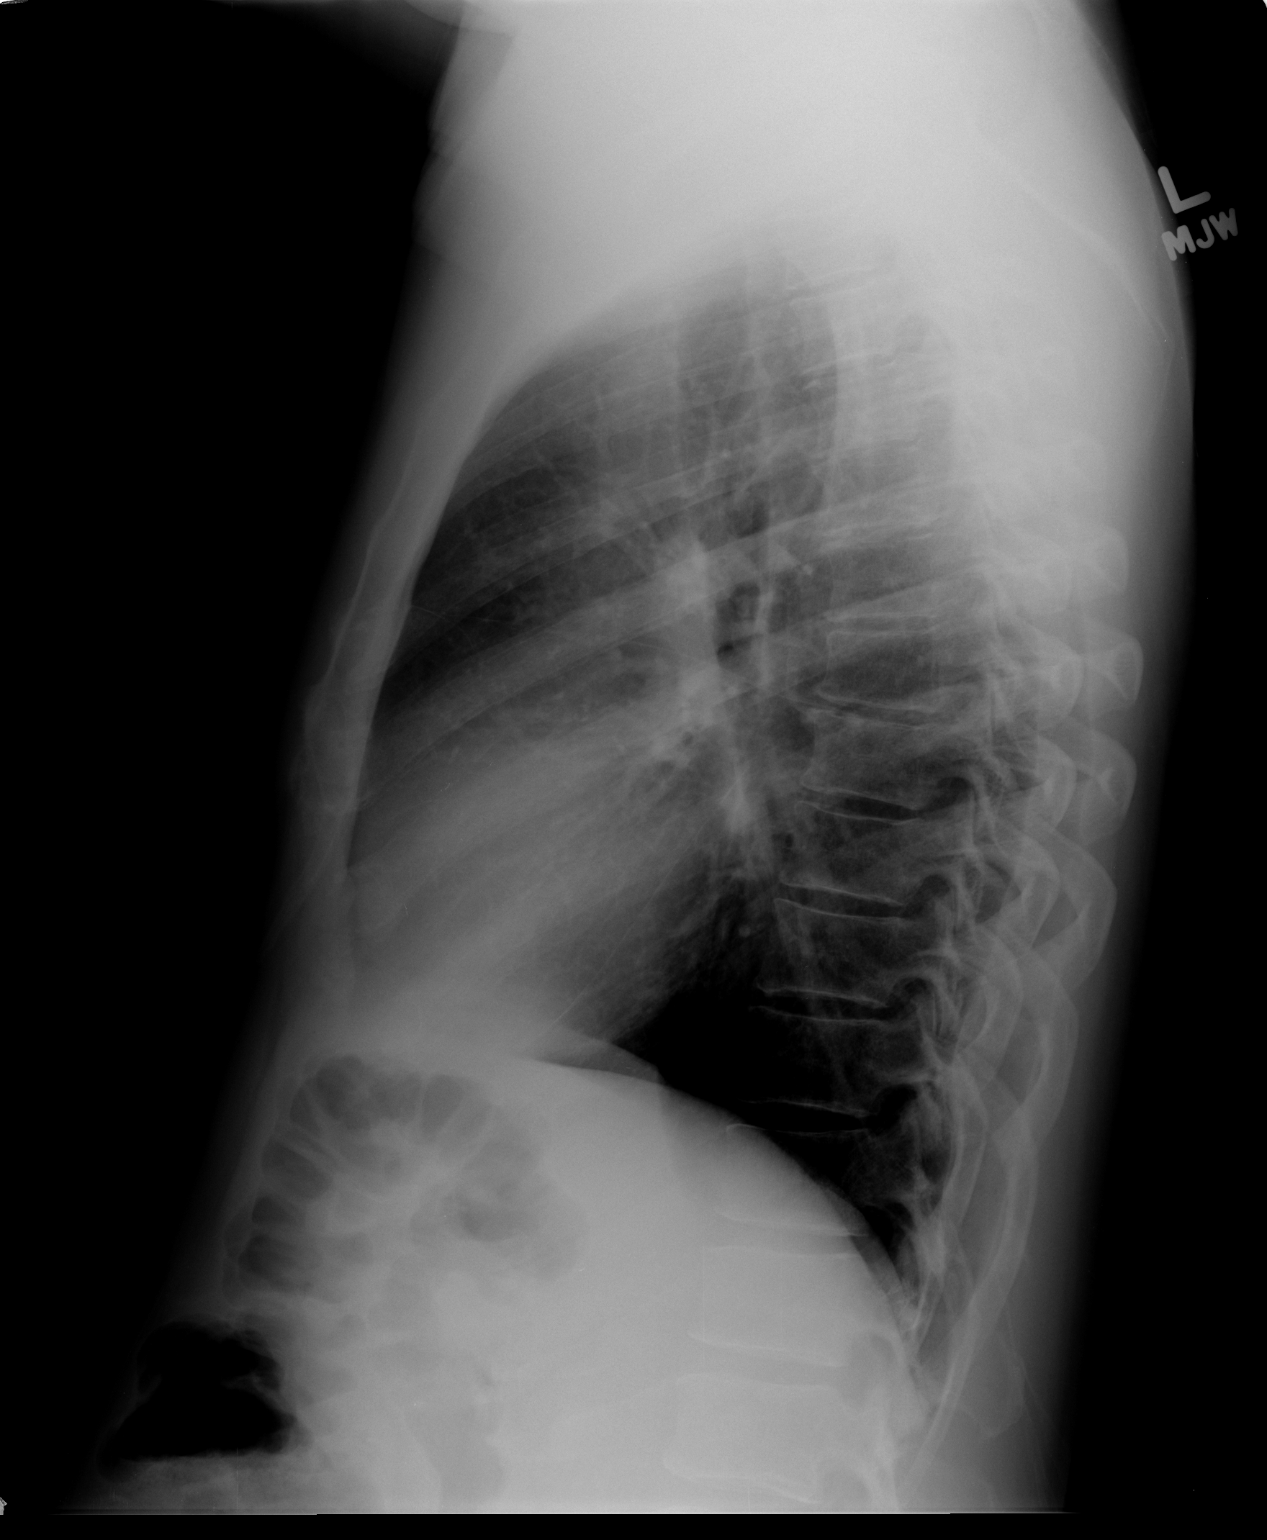

[2 of 2 positions shown; findings below may reference images not displayed]

FINDINGS: Lungs are clear.  Heart size is normal.  No pneumothorax
or pleural fluid.
IMPRESSION: Negative chest.

## 2013-11-06 IMAGING — RF DG CERVICAL SPINE 1V
1 series · 2 of 2 positions shown · non-contrast
Comparison: None.

CLINICAL DATA: C3-C6 ACDF.

DG CERVICAL SPINE - 1 VIEW

[Series 1: run · 2 of 2 slices shown]
[im 1/2]
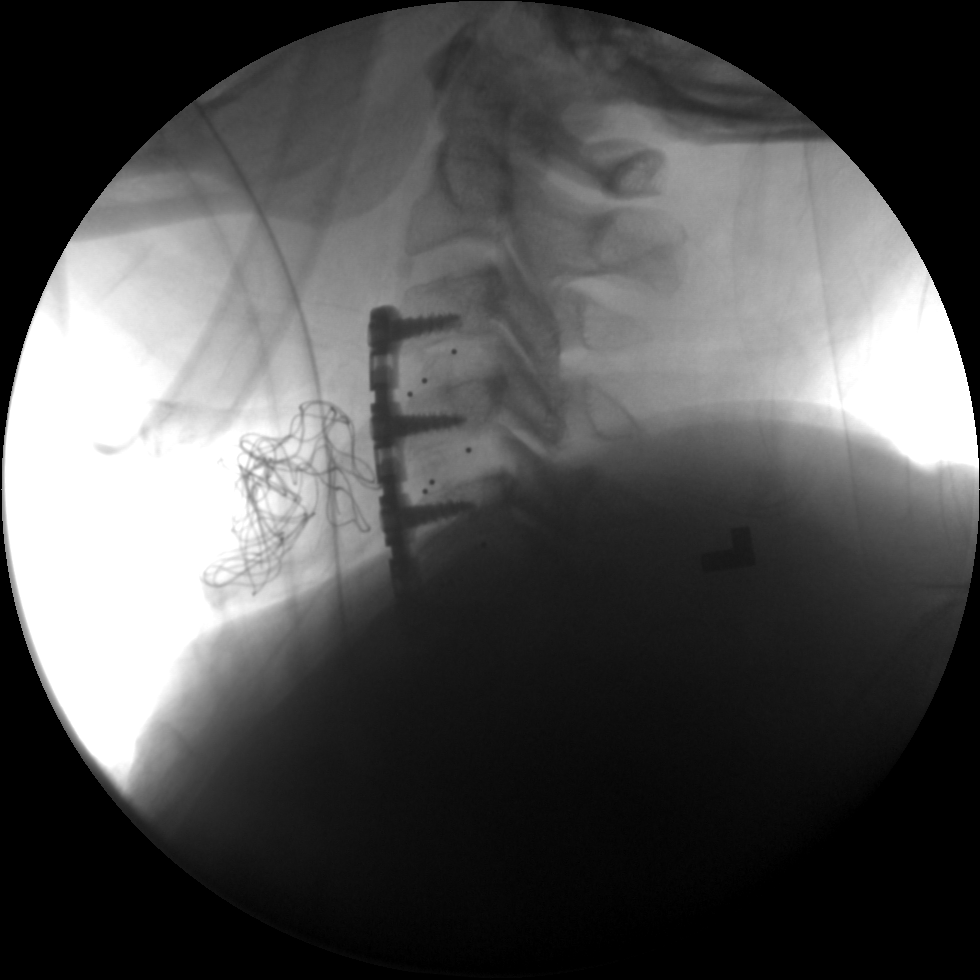
[im 2/2]
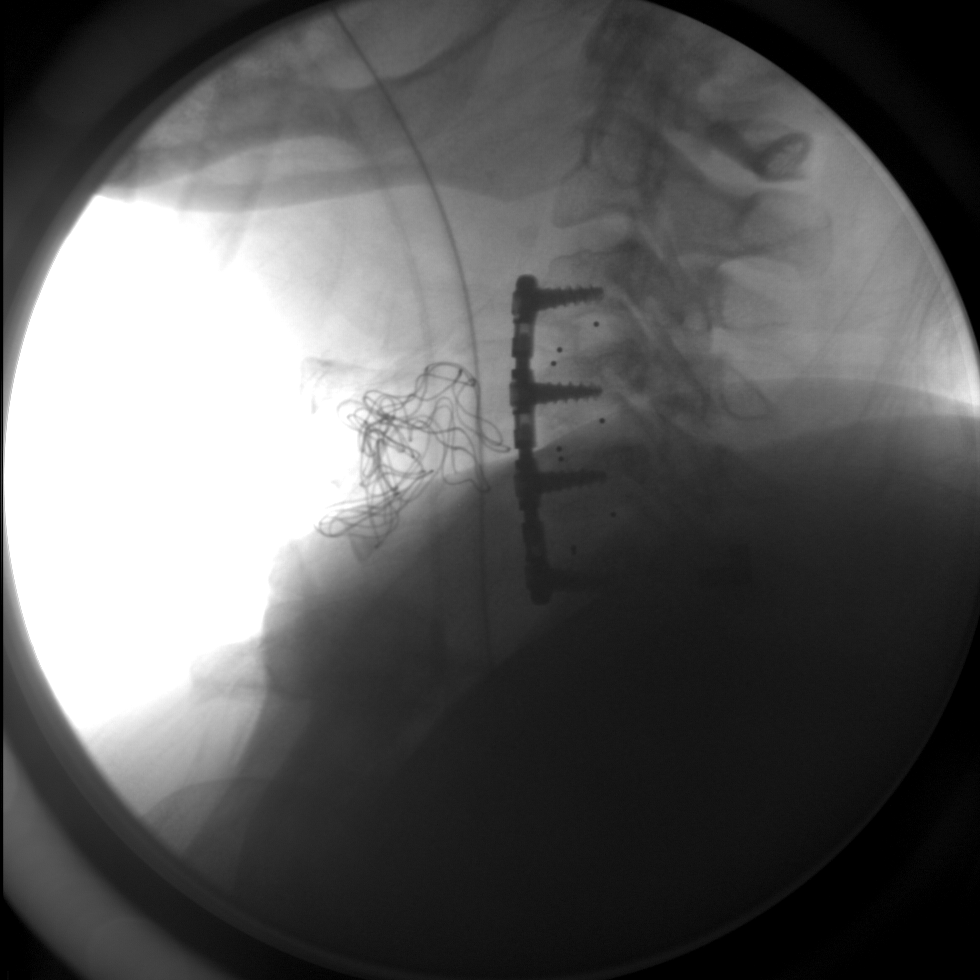

[2 of 2 positions shown; findings below may reference images not displayed]

FINDINGS: Two spot fluoroscopic images of the cervical spine are
submitted from the operating room.  These demonstrate the placement
of an anterior plate, screws and intervertebral bone plugs from C3-
C6.  Hardware is well positioned.  There is a surgical sponge
anteriorly within the operative bed.  The patient is intubated.
IMPRESSION: Intraoperative views during C3-C6 ACDF.

## 2021-12-18 ENCOUNTER — Ambulatory Visit (INDEPENDENT_AMBULATORY_CARE_PROVIDER_SITE_OTHER): Payer: Self-pay | Admitting: Cardiovascular Disease

## 2021-12-18 ENCOUNTER — Encounter: Payer: Self-pay | Admitting: Cardiovascular Disease

## 2021-12-18 VITALS — BP 124/70 | HR 78 | Ht 68.0 in | Wt 184.0 lb

## 2021-12-18 DIAGNOSIS — I2510 Atherosclerotic heart disease of native coronary artery without angina pectoris: Secondary | ICD-10-CM

## 2021-12-18 DIAGNOSIS — I739 Peripheral vascular disease, unspecified: Secondary | ICD-10-CM

## 2021-12-18 DIAGNOSIS — E785 Hyperlipidemia, unspecified: Secondary | ICD-10-CM

## 2021-12-18 DIAGNOSIS — Z720 Tobacco use: Secondary | ICD-10-CM

## 2021-12-18 DIAGNOSIS — I10 Essential (primary) hypertension: Secondary | ICD-10-CM

## 2021-12-18 NOTE — Progress Notes (Signed)
Cardiology Office Note   Date:  12/18/2021   ID:  Samuel Acosta, DOB [birth year], MRN [id number]  PCP:  No primary care provider on file.  Cardiologist:   Samuel Sacramento, MD   Chief Complaint  Patient presents with   New Patient (Initial Visit)              History of Present Illness: Samuel Acosta is a 71 y.o. male who was referred by Dr.Jah for evaluation management of peripheral arterial disease. He has known history of coronary artery disease and reports having a stent placement about 10 years ago at [facility].  In addition, he has been diabetic since 2001 and has hyperlipidemia and hypertension.  He smokes 3 to 4 cigarettes a day and has been doing so for about 20 years. He reports bilateral foot tingling and discomfort when walking a long distance.  He denies thigh or calf claudication.  He developed an ingrown left toenail in the first toe that has required frequent clipping.  He does not have open ulceration.  He was noted to have abnormal distal pulses and thus he was sent for an arterial Doppler study that reported monophasic waveforms starting in the SFA bilaterally with no ABI was reported. The patient denies any chest pain or shortness of breath.   Past Medical History:  Diagnosis Date   Arthritis    Chronic kidney disease    Kidney Stone   Diabetes mellitus    Hypertension     Past Surgical History:  Procedure Laterality Date   ANTERIOR CERVICAL DECOMP/DISCECTOMY FUSION  01/01/2012   Procedure: ANTERIOR CERVICAL DECOMPRESSION/DISCECTOMY FUSION 3 LEVELS;  Surgeon: Elaina Hoops, MD;  Location: [place] [facility];  Service: Neurosurgery;  Laterality: N/A;  Anterior Cervical Three-Four/Four-Five/Five-Six Decompression and Fusion   HIP ARTHROPLASTY  2009   Right     Current Outpatient Medications  Medication Sig Dispense Refill   aspirin EC 81 MG tablet Take 81 mg by mouth daily.     lisinopril-hydrochlorothiazide (PRINZIDE,ZESTORETIC) 10-12.5 MG per  tablet Take 1 tablet by mouth daily.     meloxicam (MOBIC) 15 MG tablet Take 15 mg by mouth daily as needed. For pain     metFORMIN (GLUCOPHAGE) 1000 MG tablet Take 1,000 mg by mouth daily.     metoprolol succinate (TOPROL-XL) 100 MG 24 hr tablet Take 100 mg by mouth daily. Take with or immediately following a meal.     Omega-3 Fatty Acids (FISH OIL PO) Take 1 capsule by mouth daily.     pioglitazone-metformin (ACTOPLUS MET) 15-850 MG per tablet Take 1 tablet by mouth 2 (two) times daily with a meal.     No current facility-administered medications for this visit.    Allergies:   Shrimp [shellfish allergy] and Other    Social History:  The patient  reports that he has been smoking. He has a 6.00 pack-year smoking history. He does not have any smokeless tobacco history on file. He reports current alcohol use of about 4.0 standard drinks of alcohol per week. He reports that he does not use drugs.   Family History:  The patient's family history is not on file.    ROS:  Please see the history of present illness.   Otherwise, review of systems are positive for none.   All other systems are reviewed and negative.    PHYSICAL EXAM: VS:  BP 124/70 (BP Location: Left Arm, Patient Position: Sitting, Cuff Size: Normal)   Pulse 78  Ht '5\' 8"'$  (1.727 m)   Wt 184 lb (83.5 kg)   BMI 27.98 kg/m  , BMI Body mass index is 27.98 kg/m. GEN: Well nourished, well developed, in no acute distress  HEENT: normal  Neck: no JVD, carotid bruits, or masses Cardiac: RRR; no murmurs, rubs, or gallops,no edema  Respiratory:  clear to auscultation bilaterally, normal work of breathing GI: soft, nontender, nondistended, + BS MS: no deformity or atrophy  Skin: warm and dry, no rash Neuro:  Strength and sensation are intact Psych: euthymic mood, full affect Vascular: Femoral pulses normal bilaterally.  Distal pulses are not palpable.   EKG:  EKG is ordered today. The ekg ordered today demonstrates normal sinus  rhythm with sinus arrhythmia   Recent Labs: No results found for requested labs within last 365 days.    Lipid Panel No results found for: "CHOL", "TRIG", "HDL", "CHOLHDL", "VLDL", "LDLCALC", "LDLDIRECT"    Wt Readings from Last 3 Encounters:  12/18/21 184 lb (83.5 kg)  12/31/11 170 lb (77.1 kg)  12/12/11 175 lb 6 oz (79.5 kg)           No data to display            ASSESSMENT AND PLAN:  1.  Peripheral arterial disease: The patient has evidence of pulm arterial disease by physical exam with absent distal pulses in the feet.  He does describe what seems to be mild bilateral foot claudication but overall no thigh or calf claudication.  Fortunately, he does not have any open ulceration.  I am going to obtain an ABI bilaterally.  Given that he does not have lifestyle limiting symptoms at the present time, it might be best to continue with medical therapy for now.  Low-dose Xarelto can be considered.  2.  Coronary artery disease involving native coronary arteries without angina: Continue low-dose aspirin.  3.  Hyperlipidemia: He reports taking a statin but does not remember the name.  I asked him to bring the rest of his medications with him.  Recommended target LDL of less than 70.  4.  Tobacco use: I discussed with him the importance of smoking cessation.  5.  Essential hypertension: Blood pressures controlled on current medications.    Disposition:   FU with me in 1 month  Signed,  Samuel Sacramento, MD  12/18/2021 9:19 AM    [place]

## 2021-12-18 NOTE — Patient Instructions (Signed)
Medication Instructions:  Your physician recommends that you continue on your current medications as directed. Please refer to the Current Medication list given to you today.  *If you need a refill on your cardiac medications before your next appointment, please call your pharmacy*  Testing/Procedures: Your physician has requested that you have a lower extremity arterial duplex. This test is an ultrasound of the arteries in the legs. It looks at arterial blood flow in the legs. Allow one hour for Lower Arterial scans. There are no restrictions or special instructions  Follow-Up: At [facility], you and your health needs are our priority.  As part of our continuing mission to provide you with exceptional heart care, we have created designated Provider Care Teams.  These Care Teams include your primary Cardiologist (physician) and Advanced Practice Providers (APPs -  Physician Assistants and Nurse Practitioners) who all work together to provide you with the care you need, when you need it.  We recommend signing up for the patient portal called "MyChart".  Sign up information is provided on this After Visit Summary.  MyChart is used to connect with patients for Virtual Visits (Telemedicine).  Patients are able to view lab/test results, encounter notes, upcoming appointments, etc.  Non-urgent messages can be sent to your provider as well.   To learn more about what you can do with MyChart, go to [email or web address].    Your next appointment:   1 month(s)  The format for your next appointment:   In Person  Provider:   Dr. Kirke Corin    Steps to Quit Smoking Smoking tobacco is the leading cause of preventable death. It can affect almost every organ in the body. Smoking puts you and people around you at risk for many serious, long-lasting (chronic) diseases. Quitting smoking can be hard, but it is one of the best things that you can do for your health. It is never too late to quit. Do not give  up if you cannot quit the first time. Some people need to try many times to quit. Do your best to stick to your quit plan, and talk with your doctor if you have any questions or concerns. How do I get ready to quit? Pick a date to quit. Set a date within the next 2 weeks to give you time to prepare. Write down the reasons why you are quitting. Keep this list in places where you will see it often. Tell your family, friends, and co-workers that you are quitting. Their support is important. Talk with your doctor about the choices that may help you quit. Find out if your health insurance will pay for these treatments. Know the people, places, things, and activities that make you want to smoke (triggers). Avoid them. What first steps can I take to quit smoking? Throw away all cigarettes at home, at work, and in your car. Throw away the things that you use when you smoke, such as ashtrays and lighters. Clean your car. Empty the ashtray. Clean your home, including curtains and carpets. What can I do to help me quit smoking? Talk with your doctor about taking medicines and seeing a counselor. You are more likely to succeed when you do both. If you are pregnant or breastfeeding: Talk with your doctor about counseling or other ways to quit smoking. Do not take medicine to help you quit smoking unless your doctor tells you to. Quit right away Quit smoking completely, instead of slowly cutting back on how much you smoke over a  period of time. Stopping smoking right away may be more successful than slowly quitting. Go to counseling. In-person is best if this is an option. You are more likely to quit if you go to counseling sessions regularly. Take medicine You may take medicines to help you quit. Some medicines need a prescription, and some you can buy over-the-counter. Some medicines may contain a drug called nicotine to replace the nicotine in cigarettes. Medicines may: Help you stop having the desire to  smoke (cravings). Help to stop the problems that come when you stop smoking (withdrawal symptoms). Your doctor may ask you to use: Nicotine patches, gum, or lozenges. Nicotine inhalers or sprays. Non-nicotine medicine that you take by mouth. Find resources Find resources and other ways to help you quit smoking and remain smoke-free after you quit. They include: Online chats with a Veterinary surgeon. Phone quitlines. Printed Materials engineer. Support groups or group counseling. Text messaging programs. Mobile phone apps. Use apps on your mobile phone or tablet that can help you stick to your quit plan. Examples of free services include Quit Guide from the CDC and smokefree.gov  What can I do to make it easier to quit?  Talk to your family and friends. Ask them to support and encourage you. Call a phone quitline, such as 1-800-QUIT-NOW, reach out to support groups, or work with a Veterinary surgeon. Ask people who smoke to not smoke around you. Avoid places that make you want to smoke, such as: Bars. Parties. Smoke-break areas at work. Spend time with people who do not smoke. Lower the stress in your life. Stress can make you want to smoke. Try these things to lower stress: Getting regular exercise. Doing deep-breathing exercises. Doing yoga. Meditating. What benefits will I see if I quit smoking? Over time, you may have: A better sense of smell and taste. Less coughing and sore throat. A slower heart rate. Lower blood pressure. Clearer skin. Better breathing. Fewer sick days. Summary Quitting smoking can be hard, but it is one of the best things that you can do for your health. Do not give up if you cannot quit the first time. Some people need to try many times to quit. When you decide to quit smoking, make a plan to help you succeed. Quit smoking right away, not slowly over a period of time. When you start quitting, get help and support to keep you smoke-free. This information is not  intended to replace advice given to you by your health care provider. Make sure you discuss any questions you have with your health care provider. Document Revised: 05/04/2021 Document Reviewed: 05/04/2021 Elsevier Patient Education  2023 [employer].

## 2021-12-26 ENCOUNTER — Ambulatory Visit (HOSPITAL_COMMUNITY)
Admission: RE | Admit: 2021-12-26 | Discharge: 2021-12-26 | Disposition: A | Discharge status: Home / Self Care | Payer: Medicare HMO | Source: Ambulatory Visit | Attending: Cardiovascular Disease | Admitting: Cardiovascular Disease

## 2021-12-26 DIAGNOSIS — I739 Peripheral vascular disease, unspecified: Secondary | ICD-10-CM | POA: Diagnosis not present

## 2022-01-22 ENCOUNTER — Ambulatory Visit: Payer: Medicare HMO | Attending: Cardiovascular Disease | Admitting: Cardiovascular Disease

## 2022-01-22 ENCOUNTER — Encounter: Payer: Self-pay | Admitting: Cardiovascular Disease

## 2022-01-22 VITALS — BP 128/70 | HR 81 | Ht 68.0 in | Wt 181.0 lb

## 2022-01-22 DIAGNOSIS — Z720 Tobacco use: Secondary | ICD-10-CM

## 2022-01-22 DIAGNOSIS — E785 Hyperlipidemia, unspecified: Secondary | ICD-10-CM | POA: Diagnosis not present

## 2022-01-22 DIAGNOSIS — I739 Peripheral vascular disease, unspecified: Secondary | ICD-10-CM

## 2022-01-22 DIAGNOSIS — I2510 Atherosclerotic heart disease of native coronary artery without angina pectoris: Secondary | ICD-10-CM | POA: Diagnosis not present

## 2022-01-22 DIAGNOSIS — I10 Essential (primary) hypertension: Secondary | ICD-10-CM

## 2022-01-22 NOTE — Progress Notes (Signed)
Cardiology Office Note   Date:  01/22/2022   ID:  Samuel Acosta, DOB 05-12-1951, MRN [id number]  PCP:  Pcp, No  Cardiologist:   Kathlyn Sacramento, MD   Chief Complaint  Patient presents with   Follow-up    1 month.      History of Present Illness: Samuel Acosta is a 71 y.o. male who is here today for follow-up visit regarding peripheral arterial disease.   He has known history of coronary artery disease and reports having a stent placement about 10 years ago at [facility].  In addition, he has been diabetic since 2001 and has hyperlipidemia and hypertension.  He smokes 3 to 4 cigarettes a day and has been doing so for about 20 years. He was seen recently for bilateral foot tingling and discomfort when walking a long distance.   He developed an ingrown left toenail in the first toe that has required frequent clipping.   He was noted to have abnormal distal pulses and thus he was sent for an arterial Doppler study that reported monophasic waveforms starting in the SFA bilaterally with no ABI was reported. We repeated his arterial Doppler in our office which showed an ABI of 0.79 on the right and 0.87 on the left. He has been doing reasonably well overall with no chest pain, shortness of breath or palpitations.  He denies calf claudication.  He reports improvement in numbness in his feet.  He does report that his legs get tired after prolonged walking or doing yard work.  No lower extremity ulceration.   Past Medical History:  Diagnosis Date   Arthritis    Chronic kidney disease    Kidney Stone   Diabetes mellitus    Hypertension     Past Surgical History:  Procedure Laterality Date   ANTERIOR CERVICAL DECOMP/DISCECTOMY FUSION  01/01/2012   Procedure: ANTERIOR CERVICAL DECOMPRESSION/DISCECTOMY FUSION 3 LEVELS;  Surgeon: Elaina Hoops, MD;  Location: [place] [facility];  Service: Neurosurgery;  Laterality: N/A;  Anterior Cervical Three-Four/Four-Five/Five-Six Decompression and Fusion    HIP ARTHROPLASTY  2009   Right     Current Outpatient Medications  Medication Sig Dispense Refill   aspirin EC 81 MG tablet Take 81 mg by mouth daily.     atorvastatin (LIPITOR) 20 MG tablet Take 20 mg by mouth daily.     lisinopril-hydrochlorothiazide (PRINZIDE,ZESTORETIC) 10-12.5 MG per tablet Take 1 tablet by mouth daily.     meloxicam (MOBIC) 15 MG tablet Take 15 mg by mouth daily as needed. For pain     metFORMIN (GLUCOPHAGE) 1000 MG tablet Take 1,000 mg by mouth daily.     metoprolol succinate (TOPROL-XL) 100 MG 24 hr tablet Take 100 mg by mouth daily. Take with or immediately following a meal.     Omega-3 Fatty Acids (FISH OIL PO) Take 1 capsule by mouth daily.     pioglitazone-metformin (ACTOPLUS MET) 15-850 MG per tablet Take 1 tablet by mouth 2 (two) times daily with a meal.     No current facility-administered medications for this visit.    Allergies:   Shrimp [shellfish allergy] and Other    Social History:  The patient  reports that he has been smoking. He has a 6.00 pack-year smoking history. He does not have any smokeless tobacco history on file. He reports current alcohol use of about 4.0 standard drinks of alcohol per week. He reports that he does not use drugs.   Family History:  The patient's family  history is not on file.    ROS:  Please see the history of present illness.   Otherwise, review of systems are positive for none.   All other systems are reviewed and negative.    PHYSICAL EXAM: VS:  BP 128/70 (BP Location: Left Arm, Patient Position: Sitting, Cuff Size: Normal)   Pulse 81   Ht $R'5\' 8"'Gd$  (1.727 m)   Wt 181 lb (82.1 kg)   BMI 27.52 kg/m  , BMI Body mass index is 27.52 kg/m. GEN: Well nourished, well developed, in no acute distress  HEENT: normal  Neck: no JVD, carotid bruits, or masses Cardiac: RRR; no murmurs, rubs, or gallops,no edema  Respiratory:  clear to auscultation bilaterally, normal work of breathing GI: soft, nontender, nondistended,  + BS MS: no deformity or atrophy  Skin: warm and dry, no rash Neuro:  Strength and sensation are intact Psych: euthymic mood, full affect    EKG:  EKG is not ordered today.    Recent Labs: No results found for requested labs within last 365 days.    Lipid Panel No results found for: "CHOL", "TRIG", "HDL", "CHOLHDL", "VLDL", "LDLCALC", "LDLDIRECT"    Wt Readings from Last 3 Encounters:  01/22/22 181 lb (82.1 kg)  12/18/21 184 lb (83.5 kg)  12/31/11 170 lb (77.1 kg)           No data to display            ASSESSMENT AND PLAN:  1.  Peripheral arterial disease: The patient has mildly reduced ABI bilaterally with absent distal pulses highly suggestive of femoral-popliteal or tibial disease.  Fortunately, he has minimal claudications at the present time and no lower extremity ulceration.  Thus, I recommend continued medical therapy.  I discussed with him the importance of a walking exercise program and provided him with instructions.  We will consider a small dose for Xarelto in the future.  2.  Coronary artery disease involving native coronary arteries without angina: Continue low-dose aspirin.  3.  Hyperlipidemia: He is supposed to be on atorvastatin but does admit that he does not take the medication on a regular basis.  I discussed with him the rationale for treatment with a statin therapy given his PAD, CAD and diabetes status.  Recommended target LDL of less than 70.  4.  Tobacco use: I again discussed with him the importance of smoking cessation.  5.  Essential hypertension: Blood pressures controlled on current medications.    Disposition:   FU with me in 6 months  Signed,  Kathlyn Sacramento, MD  01/22/2022 9:37 AM    [place]

## 2022-01-22 NOTE — Patient Instructions (Addendum)
Medication Instructions:  The current medical regimen is effective;  continue present plan and medications.  *If you need a refill on your cardiac medications before your next appointment, please call your pharmacy*   Follow-Up: At [facility], you and your health needs are our priority.  As part of our continuing mission to provide you with exceptional heart care, we have created designated Provider Care Teams.  These Care Teams include your primary Cardiologist (physician) and Advanced Practice Providers (APPs -  Physician Assistants and Nurse Practitioners) who all work together to provide you with the care you need, when you need it.  We recommend signing up for the patient portal called "MyChart".  Sign up information is provided on this After Visit Summary.  MyChart is used to connect with patients for Virtual Visits (Telemedicine).  Patients are able to view lab/test results, encounter notes, upcoming appointments, etc.  Non-urgent messages can be sent to your provider as well.   To learn more about what you can do with MyChart, go to [email or web address].    Your next appointment:   6 month(s)  The format for your next appointment:   In Person  Provider:   Lorine Bears, MD    Other Instructions  EXERCISE PROGRAM FOR INDIVIDUALS WITH  PERIPHERAL ARTERIAL DISEASE (PAD)   General Information:   Research in vascular exercise has demonstrated remarkable improvement in symptoms of leg pain (claudication) without expensive or invasive interventions. Regular walking programs are extremely helpful for patients with PAD and intermittent claudication.  These steps are designed to help you get started with a safe and effective program to help you walk farther with less pain:   Walk at least three times a week (preferably every day).  Your goal is to build up to 30-45 minutes of total walking time (not counting rest breaks). It may take you several weeks to build up your  exercise time starting at 5-10 minutes or whatever you can tolerate.  Walk as far as possible using moderate to maximal pain (7-8 on the scale below) as a signal to stop, and resume walking when the pain goes away.  On a treadmill, set the speed and grade at a level that brings on the claudication pain within 3 to 5 minutes. Walk at this rate until you experience claudication of moderate severity, rest until the pain improves, and then resume walking.  Over time, you will be able to walk longer at the designated speed and grade; workload should then be increased until you develop the pain within 3 to 5 minutes once again.  This regimen will induce a significant benefit. Studies have demonstrated that participants may be able to walk up to three or four times farther and have less leg pain, within twelve weeks, by following this protocol.  Pain Scale    0_____1_____2_____3_____4_____5_____6_____7_____8_____9_____10   No Pain                                   Moderate Pain                               Maximal Pain

## 2022-02-20 ENCOUNTER — Telehealth: Payer: Self-pay | Admitting: Cardiovascular Disease

## 2022-02-20 NOTE — Telephone Encounter (Signed)
Recvd records from [facility] forwarded 5 pages to Dr. Darlis Loan [facility] 9/28/2023FBG

## 2022-12-03 ENCOUNTER — Encounter: Payer: Self-pay | Admitting: Cardiovascular Disease

## 2022-12-03 ENCOUNTER — Ambulatory Visit: Payer: Medicare HMO | Attending: Cardiovascular Disease | Admitting: Cardiovascular Disease

## 2022-12-03 VITALS — BP 122/84 | HR 73 | Ht 68.0 in | Wt 180.0 lb

## 2022-12-03 DIAGNOSIS — I2510 Atherosclerotic heart disease of native coronary artery without angina pectoris: Secondary | ICD-10-CM

## 2022-12-03 DIAGNOSIS — I739 Peripheral vascular disease, unspecified: Secondary | ICD-10-CM

## 2022-12-03 DIAGNOSIS — E785 Hyperlipidemia, unspecified: Secondary | ICD-10-CM

## 2022-12-03 DIAGNOSIS — I10 Essential (primary) hypertension: Secondary | ICD-10-CM

## 2022-12-03 DIAGNOSIS — Z720 Tobacco use: Secondary | ICD-10-CM | POA: Diagnosis not present

## 2022-12-03 NOTE — Patient Instructions (Signed)
Medication Instructions:  No changes *If you need a refill on your cardiac medications before your next appointment, please call your pharmacy*   Lab Work: None ordered If you have labs (blood work) drawn today and your tests are completely normal, you will receive your results only by: MyChart Message (if you have MyChart) OR A paper copy in the mail If you have any lab test that is abnormal or we need to change your treatment, we will call you to review the results.   Testing/Procedures: None ordered   Follow-Up: At [facility], you and your health needs are our priority.  As part of our continuing mission to provide you with exceptional heart care, we have created designated Provider Care Teams.  These Care Teams include your primary Cardiologist (physician) and Advanced Practice Providers (APPs -  Physician Assistants and Nurse Practitioners) who all work together to provide you with the care you need, when you need it.  We recommend signing up for the patient portal called "MyChart".  Sign up information is provided on this After Visit Summary.  MyChart is used to connect with patients for Virtual Visits (Telemedicine).  Patients are able to view lab/test results, encounter notes, upcoming appointments, etc.  Non-urgent messages can be sent to your provider as well.   To learn more about what you can do with MyChart, go to [email or web address]://[email or web address].[email or web address].    Your next appointment:   6 month(s)  Provider:   Dr. Arida Other Instructions Managing the Challenge of Quitting Smoking Quitting smoking is a physical and mental challenge. You may have cravings, withdrawal symptoms, and temptation to smoke. Before quitting, work with your health care provider to make a plan that can help you manage quitting. Making a plan before you quit may keep you from smoking when you have the urge to smoke while trying to quit. How to manage lifestyle changes Managing stress Stress can make you want  to smoke, and wanting to smoke may cause stress. It is important to find ways to manage your stress. You could try some of the following: Practice relaxation techniques. Breathe slowly and deeply, in through your nose and out through your mouth. Listen to music. Soak in a bath or take a shower. Imagine a peaceful place or vacation. Get some support. Talk with family or friends about your stress. Join a support group. Talk with a counselor or therapist. Get some physical activity. Go for a walk, run, or bike ride. Play a favorite sport. Practice yoga.  Medicines Talk with your health care provider about medicines that might help you deal with cravings and make quitting easier for you. Relationships Social situations can be difficult when you are quitting smoking. To manage this, you can: Avoid parties and other social situations where people might be smoking. Avoid alcohol. Leave right away if you have the urge to smoke. Explain to your family and friends that you are quitting smoking. Ask for support and let them know you might be a bit grumpy. Plan activities where smoking is not an option. General instructions Be aware that many people gain weight after they quit smoking. However, not everyone does. To keep from gaining weight, have a plan in place before you quit, and stick to the plan after you quit. Your plan should include: Eating healthy snacks. When you have a craving, it may help to: Eat popcorn, or try carrots, celery, or other cut vegetables. Chew sugar-free gum. Changing how you eat. Eat small portion sizes at   meals. Eat 4-6 small meals throughout the day instead of 1-2 large meals a day. Be mindful when you eat. You should avoid watching television or doing other things that might distract you as you eat. Exercising regularly. Make time to exercise each day. If you do not have time for a long workout, do short bouts of exercise for 5-10 minutes several times a day. Do  some form of strengthening exercise, such as weight lifting. Do some exercise that gets your heart beating and causes you to breathe deeply, such as walking fast, running, swimming, or biking. This is very important. Drinking plenty of water or other low-calorie or no-calorie drinks. Drink enough fluid to keep your urine pale yellow.  How to recognize withdrawal symptoms Your body and mind may experience discomfort as you try to get used to not having nicotine in your system. These effects are called withdrawal symptoms. They may include: Feeling hungrier than normal. Having trouble concentrating. Feeling irritable or restless. Having trouble sleeping. Feeling depressed. Craving a cigarette. These symptoms may surprise you, but they are normal to have when quitting smoking. To manage withdrawal symptoms: Avoid places, people, and activities that trigger your cravings. Remember why you want to quit. Get plenty of sleep. Avoid coffee and other drinks that contain caffeine. These may worsen some of your symptoms. How to manage cravings Come up with a plan for how to deal with your cravings. The plan should include the following: A definition of the specific situation you want to deal with. An activity or action you will take to replace smoking. A clear idea for how this action will help. The name of someone who could help you with this. Cravings usually last for 5-10 minutes. Consider taking the following actions to help you with your plan to deal with cravings: Keep your mouth busy. Chew sugar-free gum. Suck on hard candies or a straw. Brush your teeth. Keep your hands and body busy. Change to a different activity right away. Squeeze or play with a ball. Do an activity or a hobby, such as making bead jewelry, practicing needlepoint, or working with wood. Mix up your normal routine. Take a short exercise break. Go for a quick walk, or run up and down stairs. Focus on doing something  kind or helpful for someone else. Call a friend or family member to talk during a craving. Join a support group. Contact a quitline. Where to find support To get help or find a support group: Call the National Cancer Institute's Smoking Quitline: 1-800-QUIT-NOW (784-8669) Text QUIT to SmokefreeTXT: 478848 Where to find more information Visit these websites to find more information on quitting smoking: U.S. Department of Health and Human Services: www.smokefree.gov American Lung Association: www.freedomfromsmoking.org Centers for Disease Control and Prevention (CDC): www.cdc.gov American Heart Association: www.heart.org Contact a health care provider if: You want to change your plan for quitting. The medicines you are taking are not helping. Your eating feels out of control or you cannot sleep. You feel depressed or become very anxious. Summary Quitting smoking is a physical and mental challenge. You will face cravings, withdrawal symptoms, and temptation to smoke again. Preparation can help you as you go through these challenges. Try different techniques to manage stress, handle social situations, and prevent weight gain. You can deal with cravings by keeping your mouth busy (such as by chewing gum), keeping your hands and body busy, calling family or friends, or contacting a quitline for people who want to quit smoking. You can deal   with withdrawal symptoms by avoiding places where people smoke, getting plenty of rest, and avoiding drinks that contain caffeine. This information is not intended to replace advice given to you by your health care provider. Make sure you discuss any questions you have with your health care provider. Document Revised: 05/04/2021 Document Reviewed: 05/04/2021 Elsevier Patient Education  2024 Elsevier Inc.   

## 2022-12-03 NOTE — Progress Notes (Signed)
Cardiology Office Note   Date:  12/03/2022   ID:  Samuel Acosta, DOB 27-Jan-1951, MRN [id number]  PCP:  Pcp, No  Cardiologist:   Lorine Bears, MD   No chief complaint on file.     History of Present Illness: Samuel Acosta is a 72 y.o. male who is here today for follow-up visit regarding peripheral arterial disease.   He has known history of coronary artery disease and reports having a stent placement about 10 years ago at [facility].  In addition, he has been diabetic since 2001 and has hyperlipidemia and hypertension.  He smokes 3 to 4 cigarettes a day and has been doing so for about 20 years. He was seen in 2023 for bilateral foot tingling and discomfort when walking a long distance.   He developed an ingrown left toenail in the first toe that has required frequent clipping.   He was noted to have abnormal distal pulses and thus he was sent for an arterial Doppler study which showed an ABI of 0.79 on the right and 0.87 on the left.  He has been doing reasonably well and denies chest pain or shortness of breath.  He reports some fatigue in his legs but no pain.  No lower extremity ulceration.   Past Medical History:  Diagnosis Date   Arthritis    Chronic kidney disease    Kidney Stone   Diabetes mellitus    Hypertension     Past Surgical History:  Procedure Laterality Date   ANTERIOR CERVICAL DECOMP/DISCECTOMY FUSION  01/01/2012   Procedure: ANTERIOR CERVICAL DECOMPRESSION/DISCECTOMY FUSION 3 LEVELS;  Surgeon: Mariam Dollar, MD;  Location: [facility];  Service: Neurosurgery;  Laterality: N/A;  Anterior Cervical Three-Four/Four-Five/Five-Six Decompression and Fusion   HIP ARTHROPLASTY  2009   Right     Current Outpatient Medications  Medication Sig Dispense Refill   aspirin EC 81 MG tablet Take 81 mg by mouth daily.     atorvastatin (LIPITOR) 20 MG tablet Take 20 mg by mouth daily.     lisinopril-hydrochlorothiazide (PRINZIDE,ZESTORETIC) 10-12.5 MG per tablet  Take 1 tablet by mouth daily.     meloxicam (MOBIC) 15 MG tablet Take 15 mg by mouth daily as needed. For pain     metFORMIN (GLUCOPHAGE) 1000 MG tablet Take 1,000 mg by mouth daily.     metoprolol succinate (TOPROL-XL) 100 MG 24 hr tablet Take 100 mg by mouth daily. Take with or immediately following a meal.     Omega-3 Fatty Acids (FISH OIL PO) Take 1 capsule by mouth daily.     pioglitazone-metformin (ACTOPLUS MET) 15-850 MG per tablet Take 1 tablet by mouth 2 (two) times daily with a meal.     No current facility-administered medications for this visit.    Allergies:   Shrimp [shellfish allergy] and Other    Social History:  The patient  reports that he has been smoking cigarettes. He has a 6.00 pack-year smoking history. He does not have any smokeless tobacco history on file. He reports current alcohol use of about 4.0 standard drinks of alcohol per week. He reports that he does not use drugs.   Family History:  The patient's family history is not on file.    ROS:  Please see the history of present illness.   Otherwise, review of systems are positive for none.   All other systems are reviewed and negative.    PHYSICAL EXAM: VS:  BP 122/84   Pulse 73  Ht 5\' 8"  (1.727 m)   Wt 180 lb (81.6 kg)   SpO2 97%   BMI 27.37 kg/m  , BMI Body mass index is 27.37 kg/m. GEN: Well nourished, well developed, in no acute distress  HEENT: normal  Neck: no JVD, carotid bruits, or masses Cardiac: RRR; no murmurs, rubs, or gallops,no edema  Respiratory:  clear to auscultation bilaterally, normal work of breathing GI: soft, nontender, nondistended, + BS MS: no deformity or atrophy  Skin: warm and dry, no rash Neuro:  Strength and sensation are intact Psych: euthymic mood, full affect    EKG:  EKG is  ordered today. EKG showed:  Normal sinus rhythm Normal ECG No previous ECGs available      Recent Labs: No results found for requested labs within last 365 days.    Lipid Panel No  results found for: "CHOL", "TRIG", "HDL", "CHOLHDL", "VLDL", "LDLCALC", "LDLDIRECT"    Wt Readings from Last 3 Encounters:  12/03/22 180 lb (81.6 kg)  01/22/22 181 lb (82.1 kg)  12/18/21 184 lb (83.5 kg)           No data to display            ASSESSMENT AND PLAN:  1.  Peripheral arterial disease: The patient has mildly reduced ABI bilaterally with absent distal pulses highly suggestive of femoral-popliteal or tibial disease.  He has no evidence of critical limb ischemia and has minimal claudication at this time.  I recommend continuing medical therapy.    2.  Coronary artery disease involving native coronary arteries without angina: Continue low-dose aspirin.  3.  Hyperlipidemia: Continue treatment with atorvastatin with a target LDL of less than 70.  4.  Tobacco use: I again discussed with him the importance of smoking cessation.  5.  Essential hypertension: Blood pressures controlled on current medications.    Disposition:   FU with me in 6 months  Signed,  Lorine Bears, MD  12/03/2022 10:25 AM    [facility]

## 2023-02-10 ENCOUNTER — Other Ambulatory Visit: Payer: Self-pay

## 2023-02-10 ENCOUNTER — Encounter (HOSPITAL_BASED_OUTPATIENT_CLINIC_OR_DEPARTMENT_OTHER): Payer: Self-pay | Admitting: Urology

## 2023-02-10 ENCOUNTER — Emergency Department (HOSPITAL_BASED_OUTPATIENT_CLINIC_OR_DEPARTMENT_OTHER)
Admission: EM | Admit: 2023-02-10 | Discharge: 2023-02-10 | Disposition: A | Discharge status: Home / Self Care | Payer: No Typology Code available for payment source | Attending: Emergency Medicine | Admitting: Emergency Medicine

## 2023-02-10 DIAGNOSIS — Z79899 Other long term (current) drug therapy: Secondary | ICD-10-CM | POA: Insufficient documentation

## 2023-02-10 DIAGNOSIS — S161XXA Strain of muscle, fascia and tendon at neck level, initial encounter: Secondary | ICD-10-CM

## 2023-02-10 DIAGNOSIS — Z96649 Presence of unspecified artificial hip joint: Secondary | ICD-10-CM | POA: Diagnosis not present

## 2023-02-10 DIAGNOSIS — M79645 Pain in left finger(s): Secondary | ICD-10-CM | POA: Diagnosis not present

## 2023-02-10 DIAGNOSIS — M542 Cervicalgia: Secondary | ICD-10-CM | POA: Insufficient documentation

## 2023-02-10 DIAGNOSIS — M79642 Pain in left hand: Secondary | ICD-10-CM

## 2023-02-10 DIAGNOSIS — I129 Hypertensive chronic kidney disease with stage 1 through stage 4 chronic kidney disease, or unspecified chronic kidney disease: Secondary | ICD-10-CM | POA: Insufficient documentation

## 2023-02-10 DIAGNOSIS — N189 Chronic kidney disease, unspecified: Secondary | ICD-10-CM | POA: Insufficient documentation

## 2023-02-10 DIAGNOSIS — E1122 Type 2 diabetes mellitus with diabetic chronic kidney disease: Secondary | ICD-10-CM | POA: Diagnosis not present

## 2023-02-10 DIAGNOSIS — Y92410 Unspecified street and highway as the place of occurrence of the external cause: Secondary | ICD-10-CM | POA: Diagnosis not present

## 2023-02-10 DIAGNOSIS — Z7982 Long term (current) use of aspirin: Secondary | ICD-10-CM | POA: Insufficient documentation

## 2023-02-10 MED ORDER — LIDOCAINE 5 % EX PTCH: 1.0000 | MEDICATED_PATCH | CUTANEOUS | 0 refills | Status: DC

## 2023-02-10 MED ORDER — LIDOCAINE 5 % EX PTCH
1.0000 | MEDICATED_PATCH | CUTANEOUS | 0 refills | Status: DC
Start: 2023-02-10 — End: 2023-02-10
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
## 2023-02-17 ENCOUNTER — Emergency Department (HOSPITAL_BASED_OUTPATIENT_CLINIC_OR_DEPARTMENT_OTHER)
Admission: EM | Admit: 2023-02-17 | Discharge: 2023-02-17 | Discharge status: Against Medical Advice | Payer: No Typology Code available for payment source | Attending: Emergency Medicine | Admitting: Emergency Medicine

## 2023-02-17 ENCOUNTER — Encounter (HOSPITAL_BASED_OUTPATIENT_CLINIC_OR_DEPARTMENT_OTHER): Payer: Self-pay | Admitting: Emergency Medicine

## 2023-02-17 ENCOUNTER — Other Ambulatory Visit: Payer: Self-pay

## 2023-02-17 DIAGNOSIS — Y92410 Unspecified street and highway as the place of occurrence of the external cause: Secondary | ICD-10-CM | POA: Diagnosis not present

## 2023-02-17 DIAGNOSIS — Z5321 Procedure and treatment not carried out due to patient leaving prior to being seen by health care provider: Secondary | ICD-10-CM | POA: Diagnosis not present

## 2023-02-17 DIAGNOSIS — M7918 Myalgia, other site: Secondary | ICD-10-CM | POA: Insufficient documentation
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
## 2023-02-19 ENCOUNTER — Other Ambulatory Visit: Payer: Self-pay

## 2023-02-19 ENCOUNTER — Emergency Department (HOSPITAL_BASED_OUTPATIENT_CLINIC_OR_DEPARTMENT_OTHER)
Admission: EM | Admit: 2023-02-19 | Discharge: 2023-02-19 | Disposition: A | Discharge status: Home / Self Care | Payer: No Typology Code available for payment source | Attending: Emergency Medicine | Admitting: Emergency Medicine

## 2023-02-19 ENCOUNTER — Encounter (HOSPITAL_BASED_OUTPATIENT_CLINIC_OR_DEPARTMENT_OTHER): Payer: Self-pay | Admitting: Pediatrics

## 2023-02-19 DIAGNOSIS — Z7982 Long term (current) use of aspirin: Secondary | ICD-10-CM | POA: Diagnosis not present

## 2023-02-19 DIAGNOSIS — S43401A Unspecified sprain of right shoulder joint, initial encounter: Secondary | ICD-10-CM | POA: Insufficient documentation

## 2023-02-19 DIAGNOSIS — M62838 Other muscle spasm: Secondary | ICD-10-CM

## 2023-02-19 DIAGNOSIS — M25511 Pain in right shoulder: Secondary | ICD-10-CM | POA: Diagnosis present

## 2023-02-19 DIAGNOSIS — Y92410 Unspecified street and highway as the place of occurrence of the external cause: Secondary | ICD-10-CM | POA: Insufficient documentation

## 2023-02-19 DIAGNOSIS — S43409A Unspecified sprain of unspecified shoulder joint, initial encounter: Secondary | ICD-10-CM

## 2023-02-19 MED ORDER — LIDOCAINE 5 % EX PTCH: 1.0000 | MEDICATED_PATCH | CUTANEOUS | 0 refills | Status: DC

## 2023-02-19 MED ORDER — CYCLOBENZAPRINE HCL 10 MG PO TABS: 10.0000 mg | ORAL_TABLET | Freq: Two times a day (BID) | ORAL | 0 refills | Status: AC | PRN
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
## 2023-02-19 NOTE — Discharge Instructions (Addendum)
Recommend 1000 mg of Tylenol every 6 hours as needed for pain.  Flexeril is a muscle relaxant you can take twice a day but this medication is sedating so please be careful with its use.  Do not mix with alcohol, drugs, driving or other dangerous activities.  Consider buy lidocaine patches over-the-counter if cannot afford prescription lidocaine patches.  Follow-up with your primary care doctor.

## 2023-07-08 ENCOUNTER — Ambulatory Visit: Payer: Medicare HMO | Admitting: Cardiovascular Disease

## 2023-07-15 ENCOUNTER — Ambulatory Visit: Payer: Medicare HMO | Attending: Cardiovascular Disease | Admitting: Cardiovascular Disease

## 2023-07-15 ENCOUNTER — Encounter: Payer: Self-pay | Admitting: Cardiovascular Disease

## 2023-07-15 VITALS — BP 130/70 | HR 85 | Ht 68.0 in | Wt 182.2 lb

## 2023-07-15 DIAGNOSIS — I2510 Atherosclerotic heart disease of native coronary artery without angina pectoris: Secondary | ICD-10-CM | POA: Diagnosis not present

## 2023-07-15 DIAGNOSIS — E785 Hyperlipidemia, unspecified: Secondary | ICD-10-CM

## 2023-07-15 DIAGNOSIS — Z720 Tobacco use: Secondary | ICD-10-CM

## 2023-07-15 DIAGNOSIS — I10 Essential (primary) hypertension: Secondary | ICD-10-CM | POA: Diagnosis not present

## 2023-07-15 DIAGNOSIS — I739 Peripheral vascular disease, unspecified: Secondary | ICD-10-CM

## 2023-07-15 NOTE — Progress Notes (Signed)
Cardiology Office Note   Date:  07/15/2023   ID:  Cardale Dorer, DOB May 03, 1951, MRN [id number]  PCP:  Pcp, No  Cardiologist:   Lorine Bears, MD   No chief complaint on file.     History of Present Illness: Samuel Acosta is a 73 y.o. male who is here today for follow-up visit regarding peripheral arterial disease.   He has known history of coronary artery disease and reports having a stent placement about 10 years ago at [facility].  In addition, he has been diabetic since 2001 and has hyperlipidemia and hypertension.  He smokes 3 to 4 cigarettes a day and has been doing so for about 20 years. He was seen in 2023 for bilateral foot tingling and discomfort when walking a long distance.   He developed an ingrown left toenail in the first toe that has required frequent clipping.   He was noted to have abnormal distal pulses and thus he was sent for an arterial Doppler study which showed an ABI of 0.79 on the right and 0.87 on the left.  He denies chest pain or shortness of breath.  However, he complains of worsening left foot pain with walking a short distance.  He has no symptoms at rest and no lower extremity ulceration.   Past Medical History:  Diagnosis Date   Arthritis    Chronic kidney disease    Kidney Stone   Diabetes mellitus    Hypertension     Past Surgical History:  Procedure Laterality Date   ANTERIOR CERVICAL DECOMP/DISCECTOMY FUSION  01/01/2012   Procedure: ANTERIOR CERVICAL DECOMPRESSION/DISCECTOMY FUSION 3 LEVELS;  Surgeon: Mariam Dollar, MD;  Location: [facility];  Service: Neurosurgery;  Laterality: N/A;  Anterior Cervical Three-Four/Four-Five/Five-Six Decompression and Fusion   HIP ARTHROPLASTY  2009   Right     Current Outpatient Medications  Medication Sig Dispense Refill   aspirin EC 81 MG tablet Take 81 mg by mouth daily.     atorvastatin (LIPITOR) 20 MG tablet Take 20 mg by mouth daily.     cyclobenzaprine (FLEXERIL) 10 MG tablet Take 1  tablet (10 mg total) by mouth 2 (two) times daily as needed for muscle spasms. 20 tablet 0   lidocaine (LIDODERM) 5 % Place 1 patch onto the skin daily. Remove & Discard patch within 12 hours or as directed by MD 30 patch 0   lisinopril-hydrochlorothiazide (PRINZIDE,ZESTORETIC) 10-12.5 MG per tablet Take 1 tablet by mouth daily.     meloxicam (MOBIC) 15 MG tablet Take 15 mg by mouth daily as needed. For pain     metFORMIN (GLUCOPHAGE) 1000 MG tablet Take 1,000 mg by mouth daily.     metoprolol succinate (TOPROL-XL) 100 MG 24 hr tablet Take 100 mg by mouth daily. Take with or immediately following a meal.     Omega-3 Fatty Acids (FISH OIL PO) Take 1 capsule by mouth daily.     pioglitazone-metformin (ACTOPLUS MET) 15-850 MG per tablet Take 1 tablet by mouth 2 (two) times daily with a meal.     No current facility-administered medications for this visit.    Allergies:   Shrimp [shellfish allergy] and Other    Social History:  The patient  reports that he has been smoking cigarettes. He has a 6 pack-year smoking history. He does not have any smokeless tobacco history on file. He reports current alcohol use of about 4.0 standard drinks of alcohol per week. He reports that he does not use  drugs.   Family History:  The patient's family history is not on file.    ROS:  Please see the history of present illness.   Otherwise, review of systems are positive for none.   All other systems are reviewed and negative.    PHYSICAL EXAM: VS:  BP 130/70 (BP Location: Left Arm, Patient Position: Sitting)   Pulse 85   Ht 5\' 8"  (1.727 m)   Wt 182 lb 3.2 oz (82.6 kg)   SpO2 97%   BMI 27.70 kg/m  , BMI Body mass index is 27.7 kg/m. GEN: Well nourished, well developed, in no acute distress  HEENT: normal  Neck: no JVD, carotid bruits, or masses Cardiac: RRR; no murmurs, rubs, or gallops,no edema  Respiratory:  clear to auscultation bilaterally, normal work of breathing GI: soft, nontender, nondistended,  + BS MS: no deformity or atrophy  Skin: warm and dry, no rash Neuro:  Strength and sensation are intact Psych: euthymic mood, full affect    EKG:  EKG is  ordered today. EKG showed:  Normal sinus rhythm Normal ECG When compared with ECG of 03-Dec-2022 10:19, No significant change was found       Recent Labs: No results found for requested labs within last 365 days.    Lipid Panel No results found for: "CHOL", "TRIG", "HDL", "CHOLHDL", "VLDL", "LDLCALC", "LDLDIRECT"    Wt Readings from Last 3 Encounters:  07/15/23 182 lb 3.2 oz (82.6 kg)  02/19/23 179 lb 14.3 oz (81.6 kg)  02/17/23 179 lb 14.3 oz (81.6 kg)           No data to display            ASSESSMENT AND PLAN:  1.  Peripheral arterial disease: The patient has mildly reduced ABI bilaterally with absent distal pulses highly suggestive of femoral-popliteal or tibial disease.  He reports worsening left foot claudication and thus will obtain an ABI and duplex .  2.  Coronary artery disease involving native coronary arteries without angina: Continue low-dose aspirin.  3.  Hyperlipidemia: Continue treatment with atorvastatin with a target LDL of less than 70. He reports having routine labs with his primary care physician recently.  4.  Tobacco use: I again discussed with him the importance of smoking cessation.  5.  Essential hypertension: Blood pressures controlled on current medications.    Disposition:   FU with me in 12 months or earlier if vascular testing is significantly changed from before.  Signed,  Lorine Bears, MD  07/15/2023 10:28 AM    [facility]

## 2023-07-15 NOTE — Patient Instructions (Signed)
Medication Instructions:  No changes *If you need a refill on your cardiac medications before your next appointment, please call your pharmacy*   Lab Work: None ordered If you have labs (blood work) drawn today and your tests are completely normal, you will receive your results only by: MyChart Message (if you have MyChart) OR A paper copy in the mail If you have any lab test that is abnormal or we need to change your treatment, we will call you to review the results.   Testing/Procedures: Your physician has requested that you have a lower extremity arterial duplex. During this test, ultrasound is used to evaluate arterial blood flow in the legs. Allow one hour for this exam. There are no restrictions or special instructions. This will take place at [address] [facility], [address].  Please note: We ask at that you not bring children with you during ultrasound (echo/ vascular) testing. Due to room size and safety concerns, children are not allowed in the ultrasound rooms during exams. Our front office staff cannot provide observation of children in our lobby area while testing is being conducted. An adult accompanying a patient to their appointment will only be allowed in the ultrasound room at the discretion of the ultrasound technician under special circumstances. We apologize for any inconvenience.  Your physician has requested that you have an ankle brachial index (ABI). During this test an ultrasound and blood pressure cuff are used to evaluate the arteries that supply the arms and legs with blood. Allow thirty minutes for this exam. There are no restrictions or special instructions. This will take place at [address] [facility], [address].    Follow-Up: At [facility], you and your health needs are our priority.  As part of our continuing mission to provide you with exceptional heart care, we have created designated Provider Care Teams.  These Care Teams include your primary  Cardiologist (physician) and Advanced Practice Providers (APPs -  Physician Assistants and Nurse Practitioners) who all work together to provide you with the care you need, when you need it.  We recommend signing up for the patient portal called "MyChart".  Sign up information is provided on this After Visit Summary.  MyChart is used to connect with patients for Virtual Visits (Telemedicine).  Patients are able to view lab/test results, encounter notes, upcoming appointments, etc.  Non-urgent messages can be sent to your provider as well.   To learn more about what you can do with MyChart, go to [email or web address].    Your next appointment:   12 month(s)  Provider:   Dr. Kirke Corin

## 2023-08-12 ENCOUNTER — Ambulatory Visit (HOSPITAL_COMMUNITY)
Admission: RE | Admit: 2023-08-12 | Discharge: 2023-08-12 | Disposition: A | Discharge status: Home / Self Care | Payer: Medicare HMO | Source: Ambulatory Visit | Attending: Internal Medicine | Admitting: Internal Medicine

## 2023-08-12 ENCOUNTER — Ambulatory Visit (HOSPITAL_BASED_OUTPATIENT_CLINIC_OR_DEPARTMENT_OTHER)
Admission: RE | Admit: 2023-08-12 | Discharge: 2023-08-12 | Disposition: A | Discharge status: Home / Self Care | Payer: Medicare HMO | Source: Ambulatory Visit | Attending: Cardiovascular Disease | Admitting: Cardiovascular Disease

## 2023-08-12 DIAGNOSIS — I739 Peripheral vascular disease, unspecified: Secondary | ICD-10-CM

## 2023-08-12 LAB — VAS US ABI WITH/WO TBI
Left ABI: 0.71
Right ABI: 0.94

## 2023-08-15 ENCOUNTER — Encounter: Payer: Self-pay | Admitting: Cardiovascular Disease

## 2023-08-15 NOTE — Telephone Encounter (Signed)
 Error

## 2023-08-19 ENCOUNTER — Institutional Professional Consult (permissible substitution): Admitting: Cardiovascular Disease

## 2023-08-26 ENCOUNTER — Encounter: Payer: Self-pay | Admitting: *Deleted

## 2023-08-26 ENCOUNTER — Encounter: Payer: Self-pay | Admitting: Cardiovascular Disease

## 2023-08-26 ENCOUNTER — Ambulatory Visit: Attending: Cardiovascular Disease | Admitting: Cardiovascular Disease

## 2023-08-26 VITALS — BP 120/60 | HR 78 | Ht 68.0 in | Wt 182.8 lb

## 2023-08-26 DIAGNOSIS — I2510 Atherosclerotic heart disease of native coronary artery without angina pectoris: Secondary | ICD-10-CM | POA: Diagnosis not present

## 2023-08-26 DIAGNOSIS — I10 Essential (primary) hypertension: Secondary | ICD-10-CM | POA: Diagnosis not present

## 2023-08-26 DIAGNOSIS — E785 Hyperlipidemia, unspecified: Secondary | ICD-10-CM

## 2023-08-26 DIAGNOSIS — I739 Peripheral vascular disease, unspecified: Secondary | ICD-10-CM | POA: Diagnosis not present

## 2023-08-26 DIAGNOSIS — Z720 Tobacco use: Secondary | ICD-10-CM

## 2023-08-26 NOTE — Progress Notes (Signed)
 Cardiology Office Note   Date:  08/26/2023   ID:  Samuel Acosta, DOB 1950-09-15, MRN [id number]  PCP:  Pcp, No  Cardiologist:   Lorine Bears, MD   No chief complaint on file.     History of Present Illness: Samuel Acosta is a 73 y.o. male who is here today for follow-up visit regarding peripheral arterial disease.   He has known history of coronary artery disease and reports having a stent placement about 10 years ago at [facility].  In addition, he has been diabetic since 2001 and has hyperlipidemia and hypertension.  He smokes 3 to 4 cigarettes a day and has been doing so for about 20 years. He was seen in 2023 for bilateral foot tingling and discomfort when walking a long distance.   He developed an ingrown left toenail in the first toe that has required frequent clipping.   He was noted to have abnormal distal pulses and thus he was sent for an arterial Doppler study which showed an ABI of 0.79 on the right and 0.87 on the left.  He recently reported worsening left foot and calf pain.  I repeated his Doppler studies which showed a drop in ABI to 0.7 range bilaterally with evidence of bilateral SFA occlusion. Reports some improvement in left leg pain.  No rest pain or lower extremity ulceration.  No chest pain or shortness of breath.   Past Medical History:  Diagnosis Date   Arthritis    Chronic kidney disease    Kidney Stone   Diabetes mellitus    Hypertension     Past Surgical History:  Procedure Laterality Date   ANTERIOR CERVICAL DECOMP/DISCECTOMY FUSION  01/01/2012   Procedure: ANTERIOR CERVICAL DECOMPRESSION/DISCECTOMY FUSION 3 LEVELS;  Surgeon: Mariam Dollar, MD;  Location: [facility];  Service: Neurosurgery;  Laterality: N/A;  Anterior Cervical Three-Four/Four-Five/Five-Six Decompression and Fusion   HIP ARTHROPLASTY  2009   Right     Current Outpatient Medications  Medication Sig Dispense Refill   aspirin EC 81 MG tablet Take 81 mg by mouth daily.      atorvastatin (LIPITOR) 20 MG tablet Take 20 mg by mouth daily.     cyclobenzaprine (FLEXERIL) 10 MG tablet Take 1 tablet (10 mg total) by mouth 2 (two) times daily as needed for muscle spasms. 20 tablet 0   gabapentin (NEURONTIN) 100 MG capsule Take 100 mg by mouth daily.     lisinopril-hydrochlorothiazide (PRINZIDE,ZESTORETIC) 10-12.5 MG per tablet Take 1 tablet by mouth daily.     losartan (COZAAR) 50 MG tablet Take 50 mg by mouth daily.     meloxicam (MOBIC) 15 MG tablet Take 15 mg by mouth daily as needed. For pain     metFORMIN (GLUCOPHAGE) 1000 MG tablet Take 1,000 mg by mouth daily.     metoprolol succinate (TOPROL-XL) 100 MG 24 hr tablet Take 100 mg by mouth daily. Take with or immediately following a meal.     Omega-3 Fatty Acids (FISH OIL PO) Take 1 capsule by mouth daily.     pioglitazone-metformin (ACTOPLUS MET) 15-850 MG per tablet Take 1 tablet by mouth 2 (two) times daily with a meal.     tadalafil (CIALIS) 10 MG tablet Take 5 mg by mouth daily as needed.     No current facility-administered medications for this visit.    Allergies:   Latex, Shrimp [shellfish allergy], and Other    Social History:  The patient  reports that he has been  smoking cigarettes. He has a 6 pack-year smoking history. He does not have any smokeless tobacco history on file. He reports current alcohol use of about 4.0 standard drinks of alcohol per week. He reports that he does not use drugs.   Family History:  The patient's family history is not on file.    ROS:  Please see the history of present illness.   Otherwise, review of systems are positive for none.   All other systems are reviewed and negative.    PHYSICAL EXAM: VS:  BP 120/60 (BP Location: Left Arm, Patient Position: Sitting, Cuff Size: Normal)   Pulse 78   Ht 5\' 8"  (1.727 m)   Wt 182 lb 12.8 oz (82.9 kg)   SpO2 97%   BMI 27.79 kg/m  , BMI Body mass index is 27.79 kg/m. GEN: Well nourished, well developed, in no acute distress   HEENT: normal  Neck: no JVD, carotid bruits, or masses Cardiac: RRR; no murmurs, rubs, or gallops,no edema  Respiratory:  clear to auscultation bilaterally, normal work of breathing GI: soft, nontender, nondistended, + BS MS: no deformity or atrophy  Skin: warm and dry, no rash Neuro:  Strength and sensation are intact Psych: euthymic mood, full affect    EKG:  EKG is not ordered today.      Recent Labs: No results found for requested labs within last 365 days.    Lipid Panel No results found for: "CHOL", "TRIG", "HDL", "CHOLHDL", "VLDL", "LDLCALC", "LDLDIRECT"    Wt Readings from Last 3 Encounters:  08/26/23 182 lb 12.8 oz (82.9 kg)  07/15/23 182 lb 3.2 oz (82.6 kg)  02/19/23 179 lb 14.3 oz (81.6 kg)           No data to display            ASSESSMENT AND PLAN:  1.  Peripheral arterial disease: He has bilateral SFA occlusion with an ABI of 0.7 bilaterally.  He does have claudication but his symptoms are not lifestyle limiting.  Thus, I recommend continued medical therapy.  I provided him with exercise instructions.  The patient was educated on risk factors surrounding PAD. I discussed the importance of controlling risk factors and importance of regular exercise to help reduce pain. I discussed the effects of exercise on reducing claudications and improving his risk factors including hyperlipidemia and hypertension.   2.  Coronary artery disease involving native coronary arteries without angina: Continue low-dose aspirin.  3.  Hyperlipidemia: Continue treatment with atorvastatin with a target LDL of less than 70. He reports having routine labs with his primary care physician recently.  4.  Tobacco use: I again discussed with him the importance of smoking cessation.  5.  Essential hypertension: Blood pressures controlled on current medications.    Disposition:   FU with me in 6 months  Signed,  Lorine Bears, MD  08/26/2023 10:59 AM    [facility]

## 2023-08-26 NOTE — Patient Instructions (Signed)
 Medication Instructions:  No changes *If you need a refill on your cardiac medications before your next appointment, please call your pharmacy*  Lab Work: None ordered If you have labs (blood work) drawn today and your tests are completely normal, you will receive your results only by: MyChart Message (if you have MyChart) OR A paper copy in the mail If you have any lab test that is abnormal or we need to change your treatment, we will call you to review the results.  Testing/Procedures: None ordered  Follow-Up: At [facility], you and your health needs are our priority.  As part of our continuing mission to provide you with exceptional heart care, our providers are all part of one team.  This team includes your primary Cardiologist (physician) and Advanced Practice Providers or APPs (Physician Assistants and Nurse Practitioners) who all work together to provide you with the care you need, when you need it.  Your next appointment:   6 month(s)  Provider:   Lorine Bears, MD     We recommend signing up for the patient portal called "MyChart".  Sign up information is provided on this After Visit Summary.  MyChart is used to connect with patients for Virtual Visits (Telemedicine).  Patients are able to view lab/test results, encounter notes, upcoming appointments, etc.  Non-urgent messages can be sent to your provider as well.   To learn more about what you can do with MyChart, go to [email or web address].   Other Instructions EXERCISE PROGRAM FOR INDIVIDUALS WITH  PERIPHERAL ARTERIAL DISEASE (PAD)   General Information:   Research in vascular exercise has demonstrated remarkable improvement in symptoms of leg pain (claudication) without expensive or invasive interventions. Regular walking programs are extremely helpful for patients with PAD and intermittent claudication.  These steps are designed to help you get started with a safe and effective program to help you walk  farther with less pain:   Walk at least three times a week (preferably every day).  Your goal is to build up to 30-45 minutes of total walking time (not counting rest breaks). It may take you several weeks to build up your exercise time starting at 5-10 minutes or whatever you can tolerate.  Walk as far as possible using moderate to maximal pain (7-8 on the scale below) as a signal to stop, and resume walking when the pain goes away.  On a treadmill, set the speed and grade at a level that brings on the claudication pain within 3 to 5 minutes. Walk at this rate until you experience claudication of moderate severity, rest until the pain improves, and then resume walking.  Over time, you will be able to walk longer at the designated speed and grade; workload should then be increased until you develop the pain within 3 to 5 minutes once again.  This regimen will induce a significant benefit. Studies have demonstrated that participants may be able to walk up to three or four times farther and have less leg pain, within twelve weeks, by following this protocol.  Pain Scale    0_____1_____2_____3_____4_____5_____6_____7_____8_____9_____10   No Pain                                   Moderate Pain                               Maximal Pain  Managing the Challenge of Quitting Smoking Quitting smoking is a physical and mental challenge. You may have cravings, withdrawal symptoms, and temptation to smoke. Before quitting, work with your health care provider to make a plan that can help you manage quitting. Making a plan before you quit may keep you from smoking when you have the urge to smoke while trying to quit. How to manage lifestyle changes Managing stress Stress can make you want to smoke, and wanting to smoke may cause stress. It is important to find ways to manage your stress. You could try some of the following: Practice relaxation techniques. Breathe slowly and deeply, in through your nose and out  through your mouth. Listen to music. Soak in a bath or take a shower. Imagine a peaceful place or vacation. Get some support. Talk with family or friends about your stress. Join a support group. Talk with a counselor or therapist. Get some physical activity. Go for a walk, run, or bike ride. Play a favorite sport. Practice yoga.  Medicines Talk with your health care provider about medicines that might help you deal with cravings and make quitting easier for you. Relationships Social situations can be difficult when you are quitting smoking. To manage this, you can: Avoid parties and other social situations where people might be smoking. Avoid alcohol. Leave right away if you have the urge to smoke. Explain to your family and friends that you are quitting smoking. Ask for support and let them know you might be a bit grumpy. Plan activities where smoking is not an option. General instructions Be aware that many people gain weight after they quit smoking. However, not everyone does. To keep from gaining weight, have a plan in place before you quit, and stick to the plan after you quit. Your plan should include: Eating healthy snacks. When you have a craving, it may help to: Eat popcorn, or try carrots, celery, or other cut vegetables. Chew sugar-free gum. Changing how you eat. Eat small portion sizes at meals. Eat 4-6 small meals throughout the day instead of 1-2 large meals a day. Be mindful when you eat. You should avoid watching television or doing other things that might distract you as you eat. Exercising regularly. Make time to exercise each day. If you do not have time for a long workout, do short bouts of exercise for 5-10 minutes several times a day. Do some form of strengthening exercise, such as weight lifting. Do some exercise that gets your heart beating and causes you to breathe deeply, such as walking fast, running, swimming, or biking. This is very important. Drinking  plenty of water or other low-calorie or no-calorie drinks. Drink enough fluid to keep your urine pale yellow.  How to recognize withdrawal symptoms Your body and mind may experience discomfort as you try to get used to not having nicotine in your system. These effects are called withdrawal symptoms. They may include: Feeling hungrier than normal. Having trouble concentrating. Feeling irritable or restless. Having trouble sleeping. Feeling depressed. Craving a cigarette. These symptoms may surprise you, but they are normal to have when quitting smoking. To manage withdrawal symptoms: Avoid places, people, and activities that trigger your cravings. Remember why you want to quit. Get plenty of sleep. Avoid coffee and other drinks that contain caffeine. These may worsen some of your symptoms. How to manage cravings Come up with a plan for how to deal with your cravings. The plan should include the following: A definition of the specific  situation you want to deal with. An activity or action you will take to replace smoking. A clear idea for how this action will help. The name of someone who could help you with this. Cravings usually last for 5-10 minutes. Consider taking the following actions to help you with your plan to deal with cravings: Keep your mouth busy. Chew sugar-free gum. Suck on hard candies or a straw. Brush your teeth. Keep your hands and body busy. Change to a different activity right away. Squeeze or play with a ball. Do an activity or a hobby, such as making bead jewelry, practicing needlepoint, or working with wood. Mix up your normal routine. Take a short exercise break. Go for a quick walk, or run up and down stairs. Focus on doing something kind or helpful for someone else. Call a friend or family member to talk during a craving. Join a support group. Contact a quitline. Where to find support To get help or find a support group: Call the National Cancer  Institute's Smoking Quitline: 1-800-QUIT-NOW 867-607-5736) Text QUIT to SmokefreeTXT: 454098 Where to find more information Visit these websites to find more information on quitting smoking: U.S. Department of Health and Human Services: www.smokefree.gov American Lung Association: www.freedomfromsmoking.org Centers for Disease Control and Prevention (CDC): FootballExhibition.com.br American Heart Association: www.heart.org Contact a health care provider if: You want to change your plan for quitting. The medicines you are taking are not helping. Your eating feels out of control or you cannot sleep. You feel depressed or become very anxious. Summary Quitting smoking is a physical and mental challenge. You will face cravings, withdrawal symptoms, and temptation to smoke again. Preparation can help you as you go through these challenges. Try different techniques to manage stress, handle social situations, and prevent weight gain. You can deal with cravings by keeping your mouth busy (such as by chewing gum), keeping your hands and body busy, calling family or friends, or contacting a quitline for people who want to quit smoking. You can deal with withdrawal symptoms by avoiding places where people smoke, getting plenty of rest, and avoiding drinks that contain caffeine. This information is not intended to replace advice given to you by your health care provider. Make sure you discuss any questions you have with your health care provider. Document Revised: 05/04/2021 Document Reviewed: 05/04/2021 Elsevier Patient Education  2024 Elsevier Inc.      1st Floor: - Lobby - Registration  - Pharmacy  - Lab - Cafe  2nd Floor: - PV Lab - Diagnostic Testing (echo, CT, nuclear med)  3rd Floor: - Vacant  4th Floor: - TCTS (cardiothoracic surgery) - AFib Clinic - Structural Heart Clinic - Vascular Surgery  - Vascular Ultrasound  5th Floor: - HeartCare Cardiology (general and EP) - Clinical Pharmacy for  coumadin, hypertension, lipid, weight-loss medications, and med management appointments    Valet parking services will be available as well.

## 2024-04-20 ENCOUNTER — Ambulatory Visit: Admitting: Cardiovascular Disease

## 2024-05-04 ENCOUNTER — Ambulatory Visit: Attending: Cardiovascular Disease | Admitting: Cardiovascular Disease

## 2024-05-04 ENCOUNTER — Encounter: Payer: Self-pay | Admitting: Cardiovascular Disease

## 2024-05-04 VITALS — BP 122/66 | HR 75 | Ht 68.0 in | Wt 180.0 lb

## 2024-05-04 DIAGNOSIS — Z720 Tobacco use: Secondary | ICD-10-CM

## 2024-05-04 DIAGNOSIS — I739 Peripheral vascular disease, unspecified: Secondary | ICD-10-CM

## 2024-05-04 DIAGNOSIS — I2510 Atherosclerotic heart disease of native coronary artery without angina pectoris: Secondary | ICD-10-CM

## 2024-05-04 DIAGNOSIS — E785 Hyperlipidemia, unspecified: Secondary | ICD-10-CM

## 2024-05-04 DIAGNOSIS — I10 Essential (primary) hypertension: Secondary | ICD-10-CM

## 2024-05-04 NOTE — Patient Instructions (Signed)
 Medication Instructions:  No changes *If you need a refill on your cardiac medications before your next appointment, please call your pharmacy*  Lab Work: None ordered  If you have labs (blood work) drawn today and your tests are completely normal, you will receive your results only by: MyChart Message (if you have MyChart) OR A paper copy in the mail If you have any lab test that is abnormal or we need to change your treatment, we will call you to review the results.  Testing/Procedures: None ordered  Follow-Up: At [facility], you and your health needs are our priority.  As part of our continuing mission to provide you with exceptional heart care, our providers are all part of one team.  This team includes your primary Cardiologist (physician) and Advanced Practice Providers or APPs (Physician Assistants and Nurse Practitioners) who all work together to provide you with the care you need, when you need it.  Your next appointment:   12 month(s)  Provider:   Antionette Kirks, MD    We recommend signing up for the patient portal called "MyChart".  Sign up information is provided on this After Visit Summary.  MyChart is used to connect with patients for Virtual Visits (Telemedicine).  Patients are able to view lab/test results, encounter notes, upcoming appointments, etc.  Non-urgent messages can be sent to your provider as well.   To learn more about what you can do with MyChart, go to [email or web address].

## 2024-05-04 NOTE — Progress Notes (Signed)
 Cardiology Office Note   Date:  05/04/2024   ID:  Samuel Acosta, DOB [birth year], MRN [id number]  PCP:  Pcp, No  Cardiologist:   Deatrice Cage, MD   No chief complaint on file.     History of Present Illness: Samuel Acosta is a 73 y.o. male who is here today for follow-up visit regarding peripheral arterial disease.   He has known history of coronary artery disease and reports having a stent placement around 2014 at [facility].  In addition, he has been diabetic since 2001 and has hyperlipidemia and hypertension.  He smokes 3 to 4 cigarettes a day and has been doing so for about 20 years. He was seen in 2023 for bilateral foot tingling and discomfort when walking a long distance.   He developed an ingrown left toenail in the first toe that has required frequent clipping.   He was noted to have abnormal distal pulses and thus he was sent for an arterial Doppler study which showed an ABI of 0.79 on the right and 0.87 on the left.  Repeat Doppler studies in March 2025 showed a drop in ABI to 0.7 range bilaterally with evidence of bilateral SFA occlusion.  His symptoms were not lifestyle-limiting and thus he was treated medically.  I advised him to quit smoking and start daily walking.  He reports stable symptoms overall with minimal calf discomfort.  He is cutting down on tobacco use but has not been able to quit completely.  No chest pain or shortness of breath.   Past Medical History:  Diagnosis Date   Arthritis    Chronic kidney disease    Kidney Stone   Diabetes mellitus    Hypertension     Past Surgical History:  Procedure Laterality Date   ANTERIOR CERVICAL DECOMP/DISCECTOMY FUSION  01/01/2012   Procedure: ANTERIOR CERVICAL DECOMPRESSION/DISCECTOMY FUSION 3 LEVELS;  Surgeon: Samuel SHAUNNA Helling, MD;  Location: [facility];  Service: Neurosurgery;  Laterality: N/A;  Anterior Cervical Three-Four/Four-Five/Five-Six Decompression and Fusion   HIP ARTHROPLASTY  2009   Right      Current Outpatient Medications  Medication Sig Dispense Refill   aspirin  EC 81 MG tablet Take 81 mg by mouth daily.     atorvastatin (LIPITOR) 20 MG tablet Take 20 mg by mouth daily.     cyclobenzaprine  (FLEXERIL ) 10 MG tablet Take 1 tablet (10 mg total) by mouth 2 (two) times daily as needed for muscle spasms. 20 tablet 0   gabapentin (NEURONTIN) 100 MG capsule Take 100 mg by mouth daily.     lisinopril -hydrochlorothiazide  (PRINZIDE ,ZESTORETIC ) 10-12.5 MG per tablet Take 1 tablet by mouth daily.     losartan (COZAAR) 50 MG tablet Take 50 mg by mouth daily.     meloxicam (MOBIC) 15 MG tablet Take 15 mg by mouth daily as needed. For pain     metFORMIN  (GLUCOPHAGE ) 1000 MG tablet Take 1,000 mg by mouth daily.     metoprolol  succinate (TOPROL -XL) 100 MG 24 hr tablet Take 100 mg by mouth daily. Take with or immediately following a meal.     Omega-3 Fatty Acids (FISH OIL PO) Take 1 capsule by mouth daily.     pioglitazone -metformin  (ACTOPLUS MET ) 15-850 MG per tablet Take 1 tablet by mouth 2 (two) times daily with a meal.     tadalafil (CIALIS) 10 MG tablet Take 5 mg by mouth daily as needed.     No current facility-administered medications for this visit.    Allergies:  Latex, Shrimp [shellfish allergy], and Other    Social History:  The patient  reports that he has been smoking cigarettes. He has a 6 pack-year smoking history. He does not have any smokeless tobacco history on file. He reports current alcohol use of about 4.0 standard drinks of alcohol per week. He reports that he does not use drugs.   Family History:  The patient's family history is not on file.    ROS:  Please see the history of present illness.   Otherwise, review of systems are positive for none.   All other systems are reviewed and negative.    PHYSICAL EXAM: VS:  BP 122/66 (BP Location: Left Arm, Patient Position: Sitting)   Pulse 75   Ht 5' 8 (1.727 m)   Wt 180 lb (81.6 kg)   SpO2 99%   BMI 27.37 kg/m   , BMI Body mass index is 27.37 kg/m. GEN: Well nourished, well developed, in no acute distress  HEENT: normal  Neck: no JVD, carotid bruits, or masses Cardiac: RRR; no murmurs, rubs, or gallops,no edema  Respiratory:  clear to auscultation bilaterally, normal work of breathing GI: soft, nontender, nondistended, + BS MS: no deformity or atrophy  Skin: warm and dry, no rash Neuro:  Strength and sensation are intact Psych: euthymic mood, full affect    EKG:  EKG is ordered today. EKG showed: Normal sinus rhythm Normal ECG When compared with ECG of 15-Jul-2023 10:06, No significant change was found    Recent Labs: No results found for requested labs within last 365 days.    Lipid Panel No results found for: CHOL, TRIG, HDL, CHOLHDL, VLDL, LDLCALC, LDLDIRECT    Wt Readings from Last 3 Encounters:  05/04/24 180 lb (81.6 kg)  08/26/23 182 lb 12.8 oz (82.9 kg)  07/15/23 182 lb 3.2 oz (82.6 kg)           No data to display            ASSESSMENT AND PLAN:  1.  Peripheral arterial disease: He has bilateral SFA occlusion with an ABI of 0.7 bilaterally.  He does have claudication but his symptoms are not lifestyle limiting.  Continue medical therapy.  2.  Coronary artery disease involving native coronary arteries without angina: Continue low-dose aspirin .  3.  Hyperlipidemia: Continue treatment with atorvastatin with a target LDL of less than 70. He gets routine labs done with his primary care physician.  4.  Tobacco use: I again discussed with him the importance of smoking cessation.  5.  Essential hypertension: Blood pressures controlled on current medications.    Disposition:   FU with me in 12 months  Signed,  Deatrice Cage, MD  05/04/2024 11:01 AM    [facility]
